# Patient Record
Sex: Male | Born: 1985 | Race: White | Hispanic: No | Marital: Single | Smoking: Current every day smoker
Health system: Southern US, Community
[De-identification: ages and names within clinical notes are randomized; demographics above are authoritative.]

## PROBLEM LIST (undated history)

## (undated) DIAGNOSIS — E669 Obesity, unspecified: Secondary | ICD-10-CM

## (undated) DIAGNOSIS — T7840XA Allergy, unspecified, initial encounter: Secondary | ICD-10-CM

## (undated) HISTORY — DX: Allergy, unspecified, initial encounter: T78.40XA

## (undated) HISTORY — DX: Obesity, unspecified: E66.9

---
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
## 2006-05-21 ENCOUNTER — Emergency Department (HOSPITAL_COMMUNITY): Admission: EM | Admit: 2006-05-21 | Discharge: 2006-05-21 | Payer: Self-pay | Admitting: Emergency Medicine

## 2008-08-10 ENCOUNTER — Emergency Department (HOSPITAL_COMMUNITY): Admission: EM | Admit: 2008-08-10 | Discharge: 2008-08-10 | Payer: Self-pay | Admitting: Family Medicine

## 2009-06-08 ENCOUNTER — Emergency Department (HOSPITAL_COMMUNITY): Admission: EM | Admit: 2009-06-08 | Discharge: 2009-06-08 | Payer: Self-pay | Admitting: Emergency Medicine

## 2010-02-07 IMAGING — CR DG ANKLE COMPLETE 3+V*R*
3 series · 3 of 3 positions shown · non-contrast
Comparison: None

CLINICAL DATA: Pain

RIGHT ANKLE - COMPLETE 3+ VIEW

[view not recorded (1 of 3)]
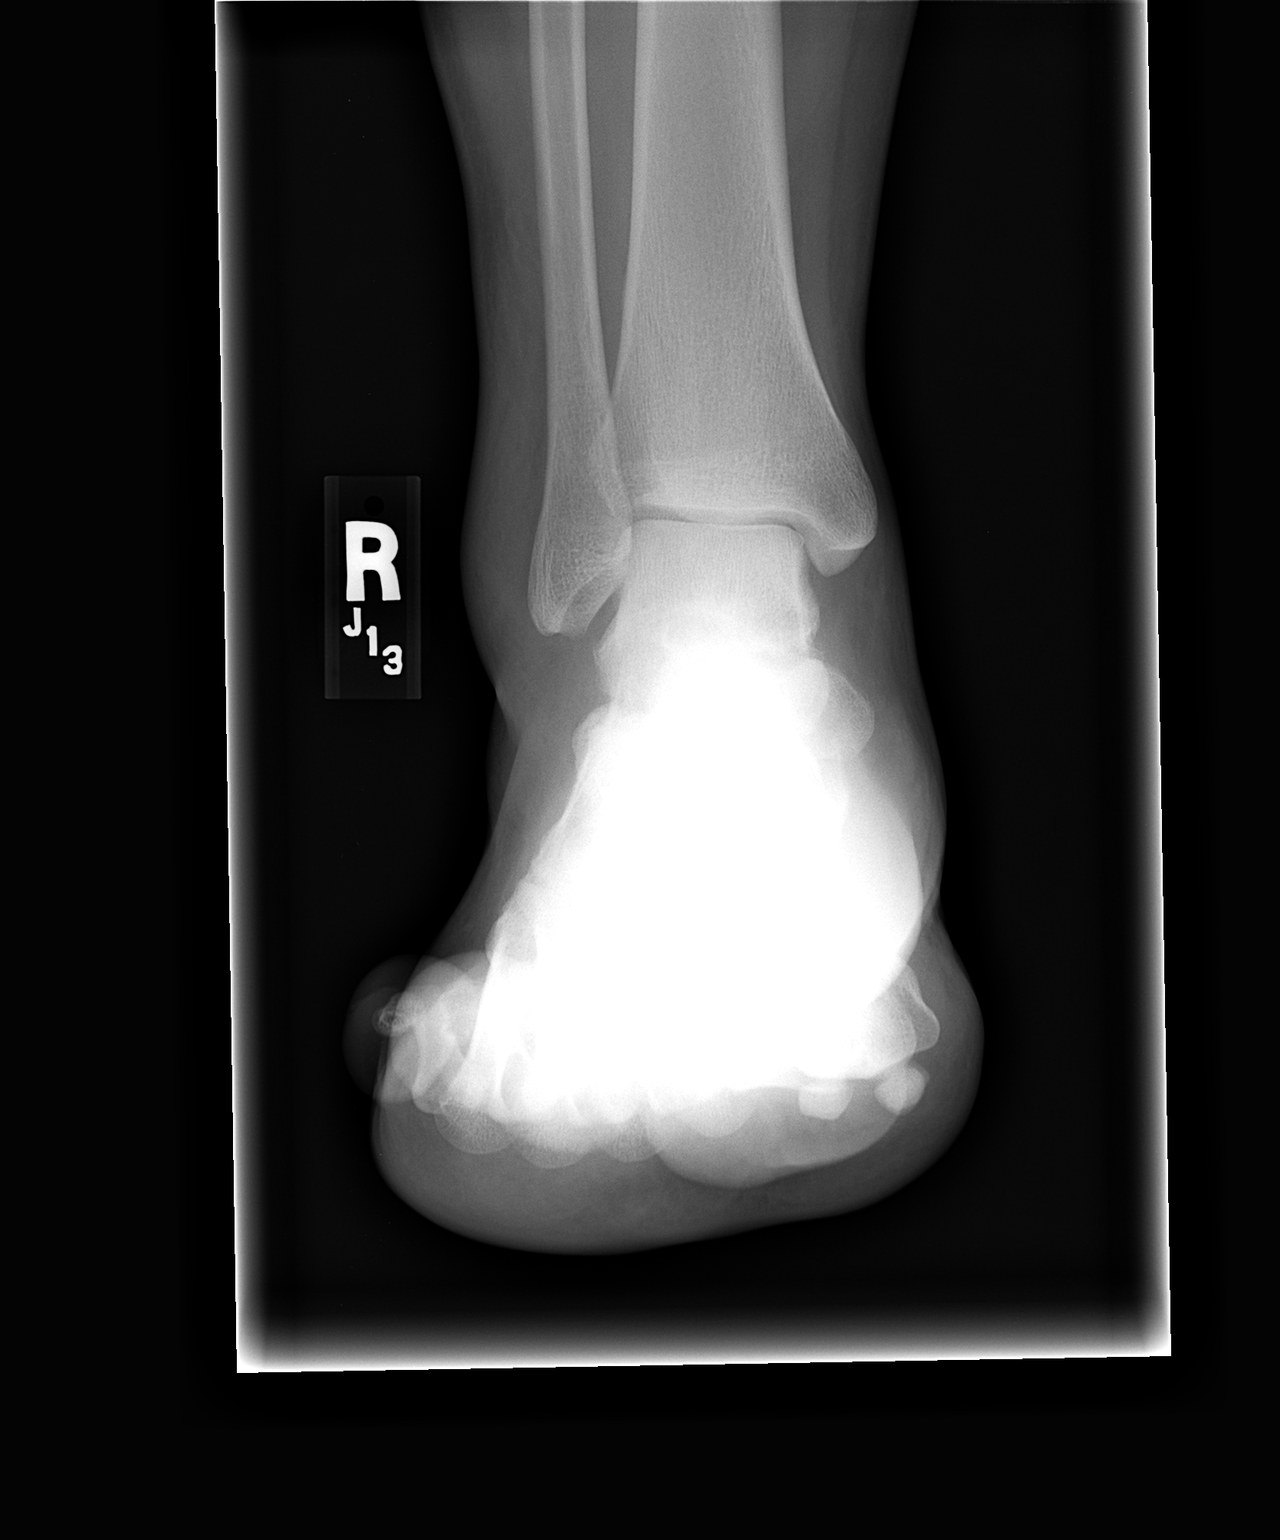

[view not recorded (2 of 3)]
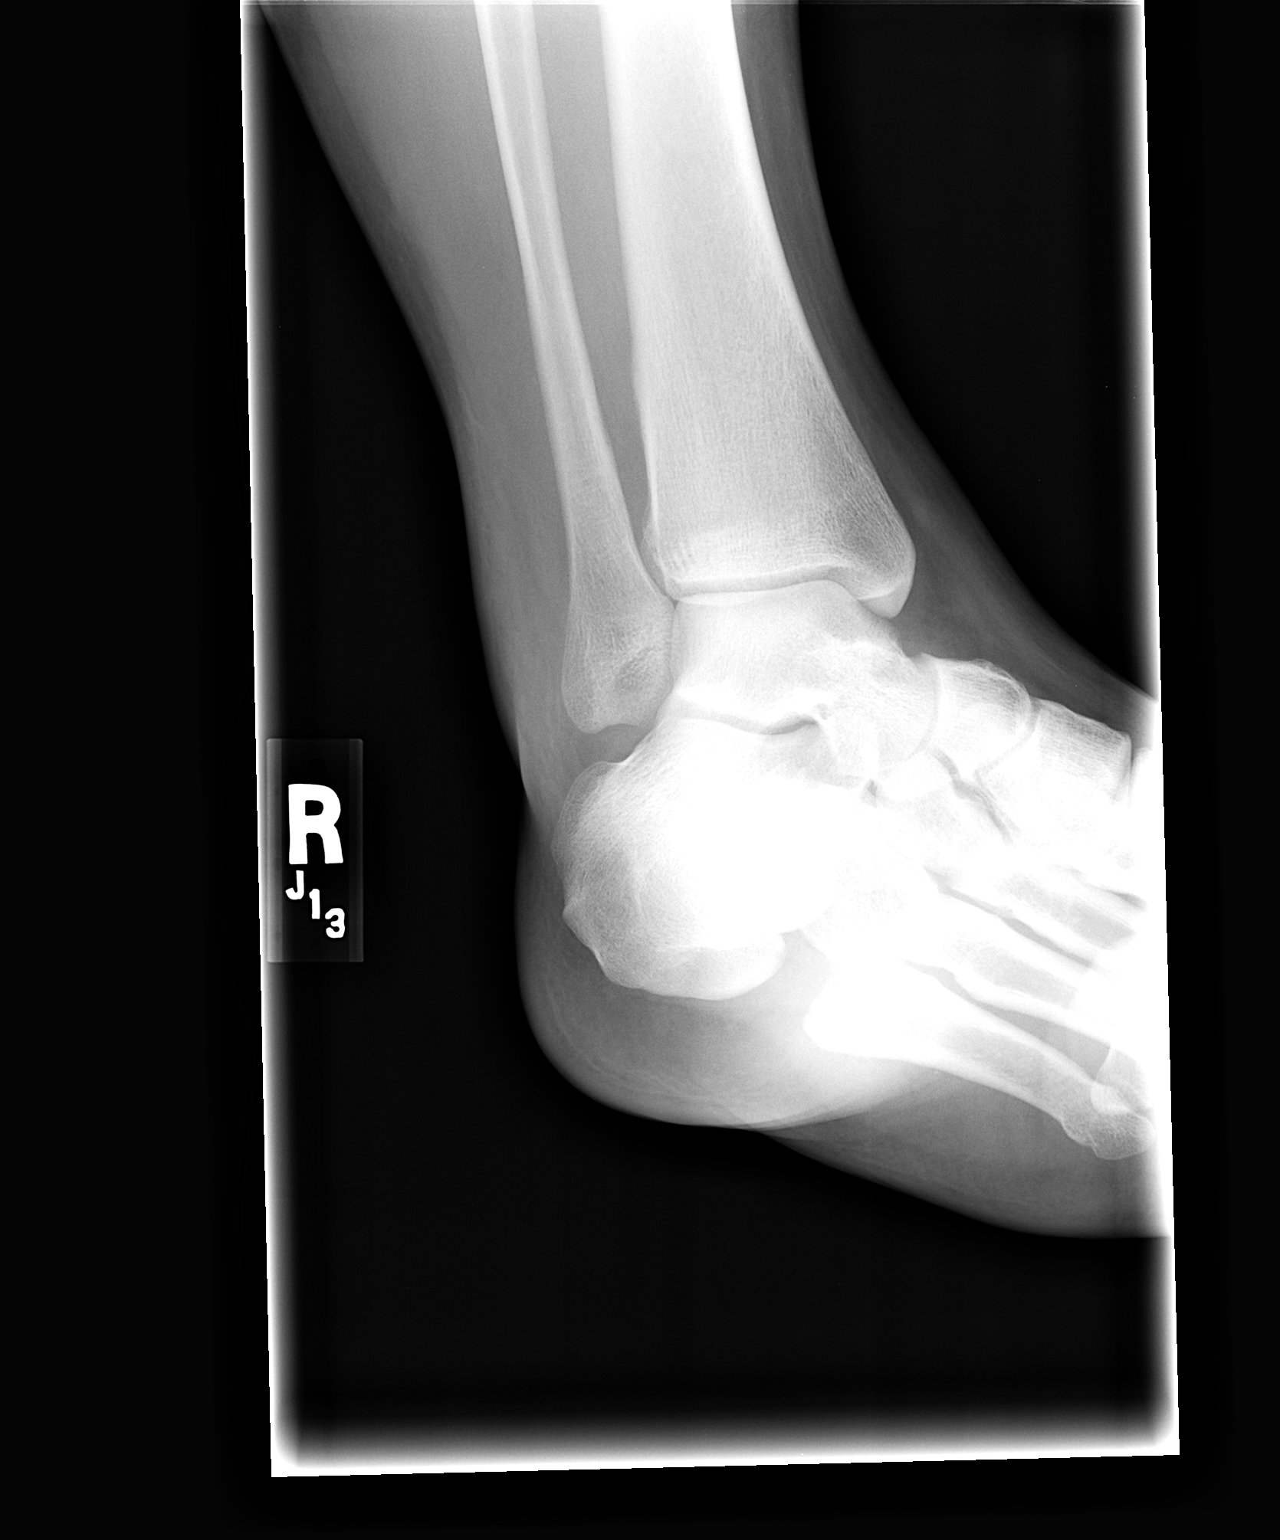

[view not recorded (3 of 3)]
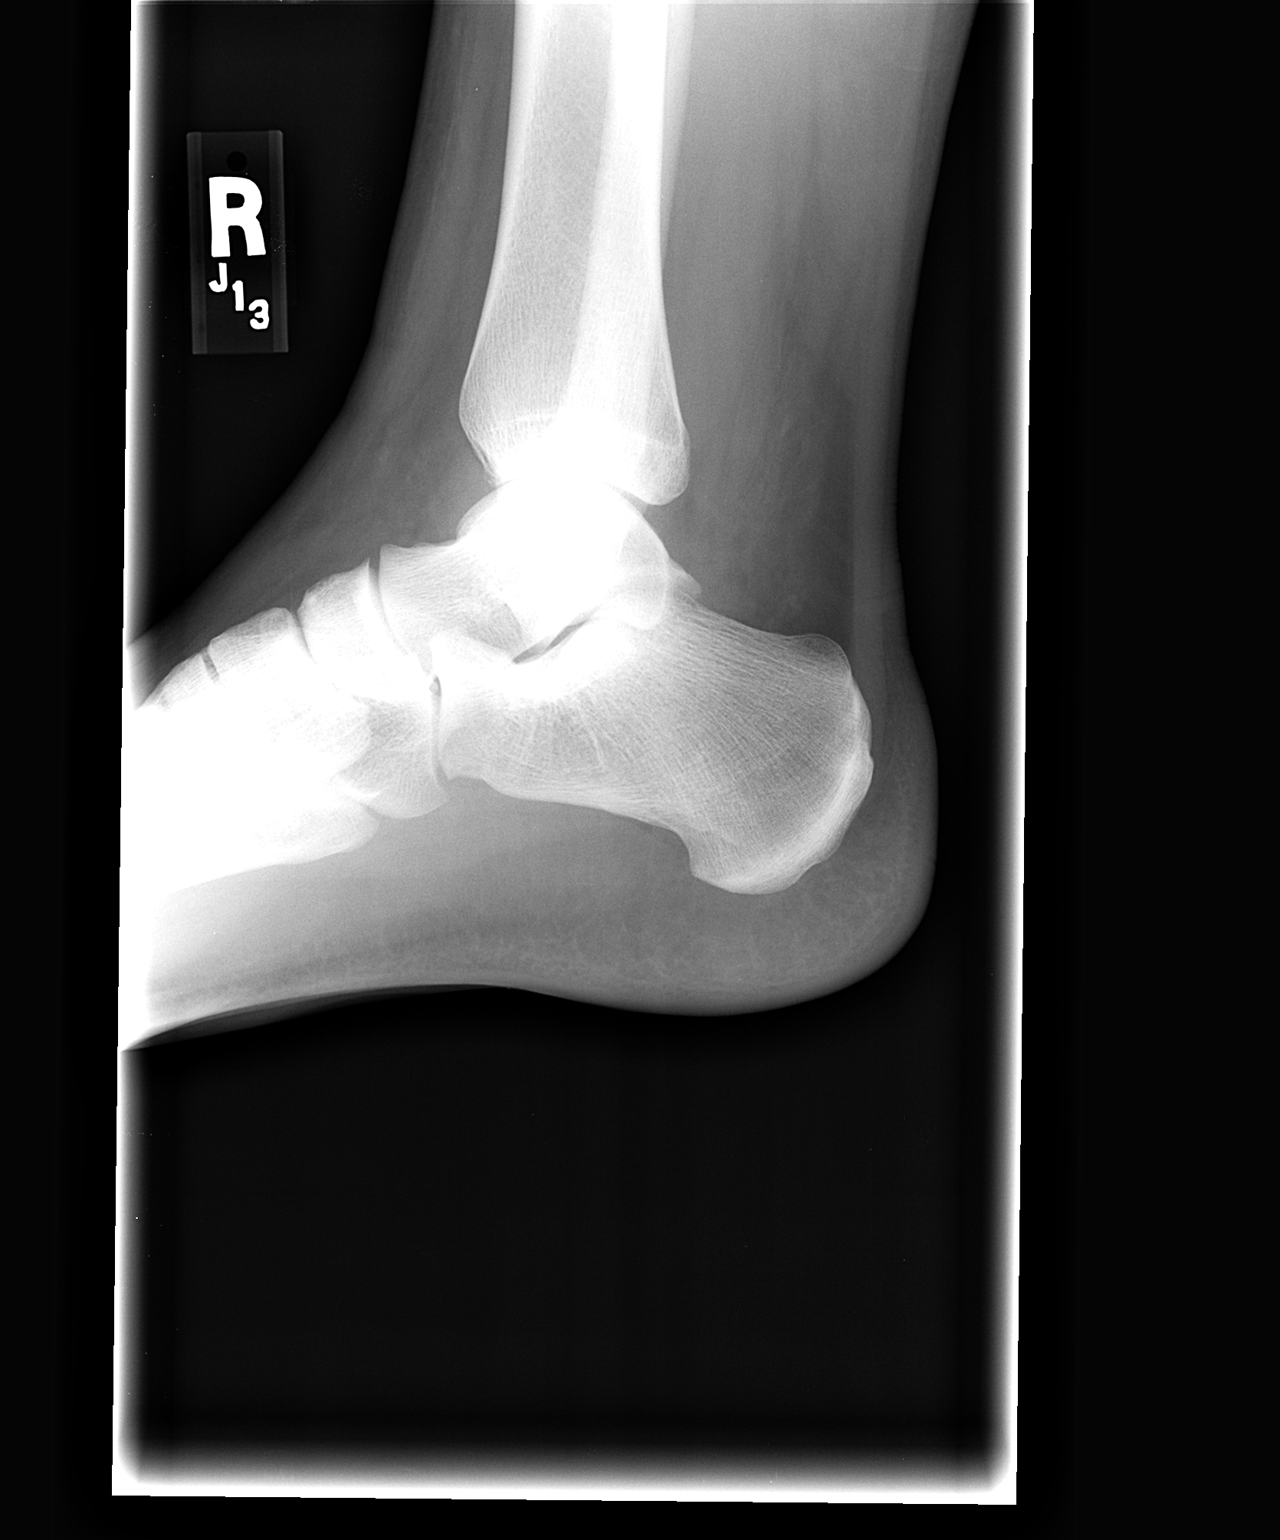

[3 of 3 positions shown; findings below may reference images not displayed]

FINDINGS: There is diffuse soft tissue swelling.

No fracture or dislocation noted.

There is no radiopaque foreign bodies or soft tissue calcifications
IMPRESSION: 1.  Soft tissue swelling.

## 2010-12-06 IMAGING — CR DG CHEST 2V
2 series · 2 of 2 positions shown · non-contrast
Comparison: 08/21/2004

CLINICAL DATA: Shortness of breath, cough

CHEST - 2 VIEW

[view not recorded (1 of 2)]
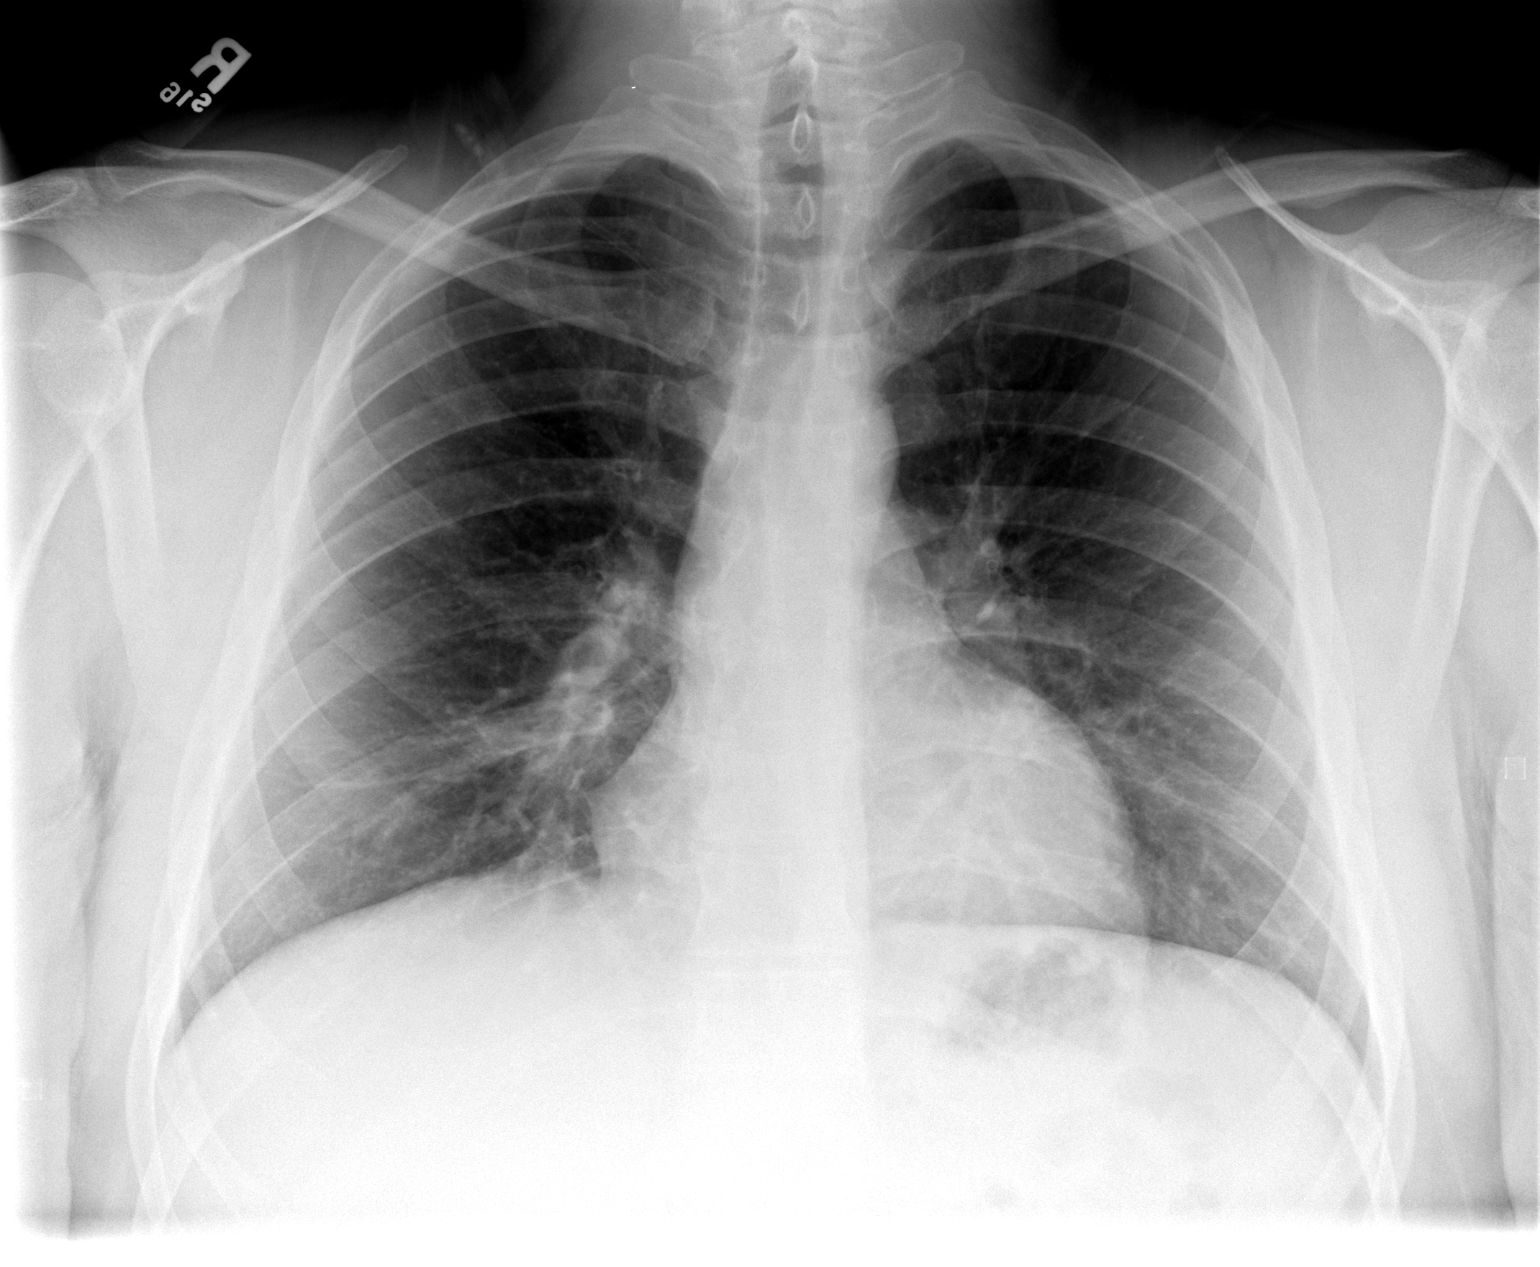

[view not recorded (2 of 2)]
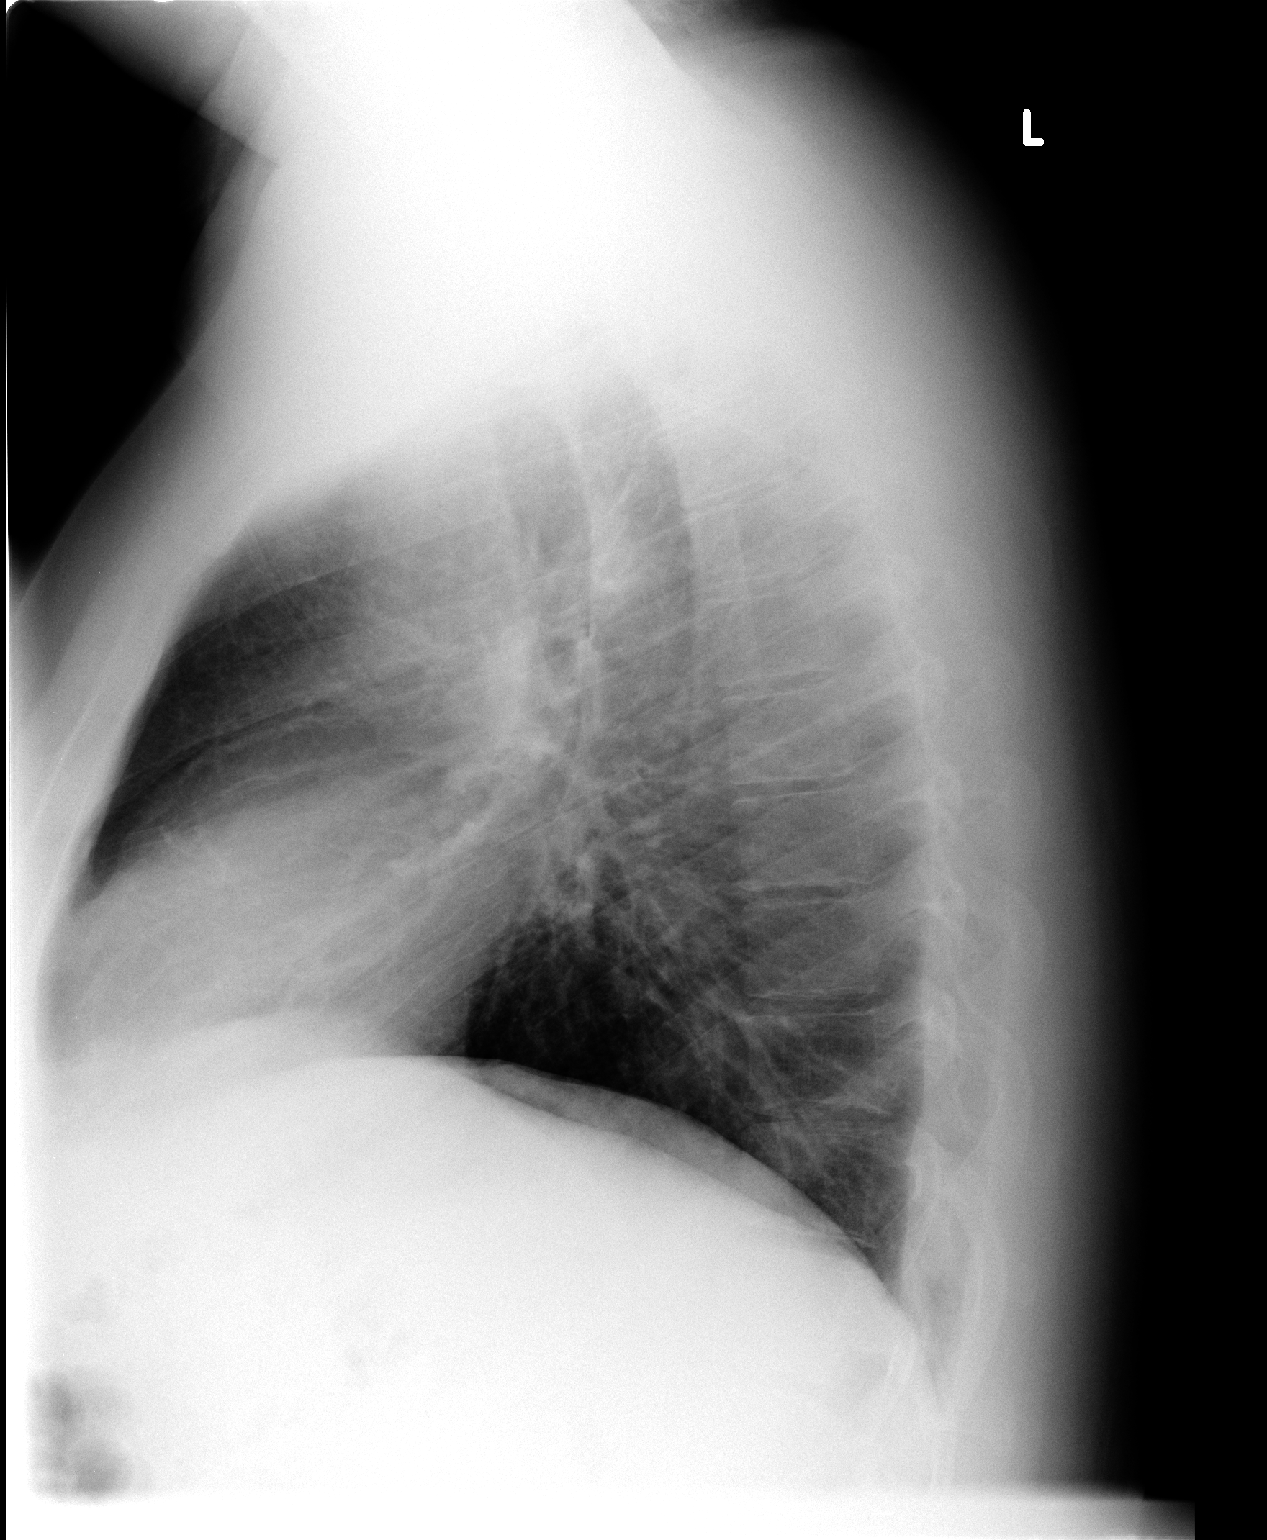

[2 of 2 positions shown; findings below may reference images not displayed]

FINDINGS: The cardiomediastinal silhouette is stable.  No acute
infiltrate or pleural effusion.  No pulmonary edema.  Bony thorax
is stable.
IMPRESSION: No active disease.

## 2011-09-21 ENCOUNTER — Ambulatory Visit: Payer: 59

## 2011-09-23 ENCOUNTER — Encounter (HOSPITAL_BASED_OUTPATIENT_CLINIC_OR_DEPARTMENT_OTHER): Payer: Self-pay | Admitting: *Deleted

## 2011-09-23 ENCOUNTER — Emergency Department (HOSPITAL_BASED_OUTPATIENT_CLINIC_OR_DEPARTMENT_OTHER)
Admission: EM | Admit: 2011-09-23 | Discharge: 2011-09-23 | Disposition: A | Discharge status: Home / Self Care | Payer: 59 | Attending: Emergency Medicine | Admitting: Emergency Medicine

## 2011-09-23 DIAGNOSIS — L089 Local infection of the skin and subcutaneous tissue, unspecified: Secondary | ICD-10-CM | POA: Insufficient documentation

## 2011-09-23 DIAGNOSIS — J45909 Unspecified asthma, uncomplicated: Secondary | ICD-10-CM | POA: Insufficient documentation

## 2011-09-23 MED ORDER — ALBUTEROL SULFATE HFA 108 (90 BASE) MCG/ACT IN AERS: 1.0000 | INHALATION_SPRAY | Freq: Four times a day (QID) | RESPIRATORY_TRACT | Status: DC | PRN

## 2011-09-23 MED ORDER — SULFAMETHOXAZOLE-TRIMETHOPRIM 800-160 MG PO TABS: 1.0000 | ORAL_TABLET | Freq: Two times a day (BID) | ORAL | Status: AC

## 2011-09-23 NOTE — ED Notes (Signed)
Pt states he has had a boil on the right side of his nose for a couple of days.

## 2011-09-23 NOTE — ED Provider Notes (Signed)
Medical screening examination/treatment/procedure(s) were performed by non-physician practitioner and as supervising physician I was immediately available for consultation/collaboration.   Eman Morimoto A Gabi Mcfate, MD 09/23/11 1530 

## 2011-09-23 NOTE — ED Provider Notes (Signed)
History     CSN: [id number]  Arrival date & time 09/23/11  1420   First MD Initiated Contact with Patient 09/23/11 1425      Chief Complaint  Patient presents with  . Recurrent Skin Infections    (Consider location/radiation/quality/duration/timing/severity/associated sxs/prior treatment) HPI Comments: Pt states that he noticed and area that is red raised and hard to the right side of the nose where his glasses rub:pt state that he has a history of abscess so he is concerned that it is going to turn into that:pt also requesting a refill on his albuterol since he doesn't have a pcp at this time  Patient is a 26 y.o. male presenting with rash. The history is provided by the patient. No language interpreter was used.  Rash  This is a new problem. The current episode started 2 days ago. The problem is associated with an unknown factor. There has been no fever. Affected Location: nose. The pain is mild. The pain has been constant since onset. Associated symptoms include pain. Pertinent negatives include no blisters, no itching and no weeping. He has tried nothing for the symptoms.    Past Medical History  Diagnosis Date  . Asthma     History reviewed. No pertinent past surgical history.  History reviewed. No pertinent family history.  History  Substance Use Topics  . Smoking status: Never Smoker   . Smokeless tobacco: Not on file  . Alcohol Use: No      Review of Systems  Skin: Positive for rash. Negative for itching.  All other systems reviewed and are negative.    Allergies  Review of patient's allergies indicates no known allergies.  Home Medications   Current Outpatient Rx  Name Route Sig Dispense Refill  . ALBUTEROL IN Inhalation Inhale into the lungs 4 (four) times daily as needed.    Marland Kitchen FLUTICASONE-SALMETEROL 250-50 MCG/DOSE IN AEPB Inhalation Inhale 1 puff into the lungs every 12 (twelve) hours.      BP 152/94  Pulse 104  Temp(Src) 97.8 F (36.6 C) (Oral)   Resp 20  Ht 5\' 11"  (1.803 m)  Wt 238 lb (107.956 kg)  BMI 33.19 kg/m2  SpO2 98%  Physical Exam  Nursing note and vitals reviewed. Constitutional: He is oriented to person, place, and time. He appears well-developed and well-nourished.  HENT:       Has a red raised firm area to the bridge of the nose on the right side:no drainage noted to the area  Eyes: Conjunctivae and EOM are normal.  Neck: Neck supple.  Cardiovascular: Normal rate and regular rhythm.   Pulmonary/Chest: Effort normal and breath sounds normal.  Musculoskeletal: Normal range of motion.  Neurological: He is alert and oriented to person, place, and time.  Skin: Skin is warm.  Psychiatric: He has a normal mood and affect.    ED Course  Procedures (including critical care time)  Labs Reviewed - No data to display No results found.   1. Skin infection       MDM  Possible early abscess although may be a sebaceous cyst:will treat for possible infection        Teressa Lower, NP 09/23/11 1447

## 2011-09-23 NOTE — Discharge Instructions (Signed)
Skin Infections A skin infection usually develops as a result of disruption of the skin barrier.  CAUSES  A skin infection might occur following:  Trauma or an injury to the skin such as a cut or insect sting.   Inflammation (as in eczema).   Breaks in the skin between the toes (as in athlete's foot).   Swelling (edema).  SYMPTOMS  The legs are the most common site affected. Usually there is:  Redness.   Swelling.   Pain.   There may be red streaks in the area of the infection.  TREATMENT   Minor skin infections may be treated with topical antibiotics, but if the skin infection is severe, hospital care and intravenous (IV) antibiotic treatment may be needed.   Most often skin infections can be treated with oral antibiotic medicine as well as proper rest and elevation of the affected area until the infection improves.   If you are prescribed oral antibiotics, it is important to take them as directed and to take all the pills even if you feel better before you have finished all of the medicine.   You may apply warm compresses to the area for 20-30 minutes 4 times daily.  You might need a tetanus shot now if:  You have no idea when you had the last one.   You have never had a tetanus shot before.   Your wound had dirt in it.  If you need a tetanus shot and you decide not to get one, there is a rare chance of getting tetanus. Sickness from tetanus can be serious. If you get a tetanus shot, your arm may swell and become red and warm at the shot site. This is common and not a problem. SEEK MEDICAL CARE IF:  The pain and swelling from your infection do not improve within 2 days.  SEEK IMMEDIATE MEDICAL CARE IF:  You develop a fever, chills, or other serious problems.  Document Released: 08/24/2004 Document Revised: 03/29/2011 Document Reviewed: 07/06/2008 [employer] Patient Information 2012 [employer]. 

## 2011-09-25 ENCOUNTER — Encounter (HOSPITAL_BASED_OUTPATIENT_CLINIC_OR_DEPARTMENT_OTHER): Payer: Self-pay | Admitting: *Deleted

## 2011-09-25 ENCOUNTER — Observation Stay (HOSPITAL_BASED_OUTPATIENT_CLINIC_OR_DEPARTMENT_OTHER)
Admission: EM | Admit: 2011-09-25 | Discharge: 2011-09-25 | Disposition: A | Discharge status: Home / Self Care | Payer: 59 | Attending: Emergency Medicine | Admitting: Emergency Medicine

## 2011-09-25 ENCOUNTER — Emergency Department (INDEPENDENT_AMBULATORY_CARE_PROVIDER_SITE_OTHER): Payer: 59

## 2011-09-25 DIAGNOSIS — R220 Localized swelling, mass and lump, head: Secondary | ICD-10-CM

## 2011-09-25 DIAGNOSIS — J3489 Other specified disorders of nose and nasal sinuses: Secondary | ICD-10-CM | POA: Insufficient documentation

## 2011-09-25 DIAGNOSIS — J45909 Unspecified asthma, uncomplicated: Secondary | ICD-10-CM | POA: Insufficient documentation

## 2011-09-25 DIAGNOSIS — L03213 Periorbital cellulitis: Secondary | ICD-10-CM

## 2011-09-25 DIAGNOSIS — L0201 Cutaneous abscess of face: Principal | ICD-10-CM | POA: Insufficient documentation

## 2011-09-25 DIAGNOSIS — L03211 Cellulitis of face: Principal | ICD-10-CM | POA: Insufficient documentation

## 2011-09-25 LAB — BASIC METABOLIC PANEL
CO2: 23 mEq/L (ref 19–32)
Chloride: 103 mEq/L (ref 96–112)
Glucose, Bld: 158 mg/dL — ABNORMAL HIGH (ref 70–99)
Potassium: 3.4 mEq/L — ABNORMAL LOW (ref 3.5–5.1)
Sodium: 138 mEq/L (ref 135–145)

## 2011-09-25 LAB — CBC
MCV: 91.7 fL (ref 78.0–100.0)
Platelets: 219 10*3/uL (ref 150–400)
RBC: 4.59 MIL/uL (ref 4.22–5.81)
RDW: 11.8 % (ref 11.5–15.5)
WBC: 10.5 10*3/uL (ref 4.0–10.5)

## 2011-09-25 LAB — DIFFERENTIAL
Basophils Absolute: 0 10*3/uL (ref 0.0–0.1)
Lymphocytes Relative: 31 % (ref 12–46)
Lymphs Abs: 3.2 10*3/uL (ref 0.7–4.0)
Neutro Abs: 6 10*3/uL (ref 1.7–7.7)

## 2011-09-25 MED ORDER — SODIUM CHLORIDE 0.9 % IV BOLUS (SEPSIS)
1000.0000 mL | Freq: Once | INTRAVENOUS | Status: AC
  Administered 2011-09-25: 1000 mL via INTRAVENOUS

## 2011-09-25 MED ORDER — SODIUM CHLORIDE 0.9 % IV SOLN: Freq: Once | INTRAVENOUS | Status: DC

## 2011-09-25 MED ORDER — HYDROCODONE-ACETAMINOPHEN 5-325 MG PO TABS: 1.0000 | ORAL_TABLET | ORAL | Status: AC | PRN

## 2011-09-25 MED ORDER — IOHEXOL 300 MG/ML  SOLN
80.0000 mL | Freq: Once | INTRAMUSCULAR | Status: AC | PRN
  Administered 2011-09-25: 80 mL via INTRAVENOUS

## 2011-09-25 MED ORDER — MORPHINE SULFATE 4 MG/ML IJ SOLN
6.0000 mg | Freq: Once | INTRAMUSCULAR | Status: AC
  Administered 2011-09-25: 6 mg via INTRAVENOUS
  Filled 2011-09-25 (×2): qty 1

## 2011-09-25 MED ORDER — ACETAMINOPHEN 325 MG PO TABS: 650.0000 mg | ORAL_TABLET | ORAL | Status: DC | PRN

## 2011-09-25 MED ORDER — AMOXICILLIN-POT CLAVULANATE 875-125 MG PO TABS: 1.0000 | ORAL_TABLET | Freq: Two times a day (BID) | ORAL | Status: AC

## 2011-09-25 MED ORDER — MORPHINE SULFATE 4 MG/ML IJ SOLN
6.0000 mg | Freq: Once | INTRAMUSCULAR | Status: AC
  Administered 2011-09-25: 6 mg via INTRAVENOUS
  Filled 2011-09-25: qty 2

## 2011-09-25 MED ORDER — VANCOMYCIN HCL IN DEXTROSE 1-5 GM/200ML-% IV SOLN
1000.0000 mg | Freq: Two times a day (BID) | INTRAVENOUS | Status: DC
  Administered 2011-09-25: 1000 mg via INTRAVENOUS
  Filled 2011-09-25: qty 200

## 2011-09-25 MED ORDER — VANCOMYCIN HCL IN DEXTROSE 1-5 GM/200ML-% IV SOLN
1000.0000 mg | Freq: Once | INTRAVENOUS | Status: AC
  Administered 2011-09-25: 1000 mg via INTRAVENOUS
  Filled 2011-09-25: qty 200

## 2011-09-25 MED ORDER — FENTANYL CITRATE 0.05 MG/ML IJ SOLN
50.0000 ug | Freq: Once | INTRAMUSCULAR | Status: AC
  Administered 2011-09-25: 50 ug via INTRAVENOUS

## 2011-09-25 MED ORDER — SODIUM CHLORIDE 0.9 % IV SOLN
1000.0000 mL | INTRAVENOUS | Status: DC
  Administered 2011-09-25: 1000 mL via INTRAVENOUS

## 2011-09-25 MED ORDER — SODIUM CHLORIDE 0.9 % IV SOLN
3.0000 g | Freq: Four times a day (QID) | INTRAVENOUS | Status: DC
  Administered 2011-09-25 (×2): 3 g via INTRAVENOUS
  Filled 2011-09-25 (×2): qty 3

## 2011-09-25 MED ORDER — FENTANYL CITRATE 0.05 MG/ML IJ SOLN
INTRAMUSCULAR | Status: AC
  Administered 2011-09-25: 50 ug via INTRAVENOUS
  Filled 2011-09-25: qty 2

## 2011-09-25 MED ORDER — MORPHINE SULFATE 4 MG/ML IJ SOLN
4.0000 mg | INTRAMUSCULAR | Status: DC | PRN
  Administered 2011-09-25 (×2): 4 mg via INTRAVENOUS
  Filled 2011-09-25 (×2): qty 1

## 2011-09-25 MED ORDER — POTASSIUM CHLORIDE CRYS ER 20 MEQ PO TBCR
20.0000 meq | EXTENDED_RELEASE_TABLET | Freq: Once | ORAL | Status: AC
  Administered 2011-09-25: 20 meq via ORAL
  Filled 2011-09-25: qty 1

## 2011-09-25 MED ORDER — ZOLPIDEM TARTRATE 5 MG PO TABS: 5.0000 mg | ORAL_TABLET | Freq: Every evening | ORAL | Status: DC | PRN

## 2011-09-25 MED ORDER — ONDANSETRON HCL 4 MG/2ML IJ SOLN
4.0000 mg | Freq: Four times a day (QID) | INTRAMUSCULAR | Status: DC | PRN
  Administered 2011-09-25: 4 mg via INTRAVENOUS
  Filled 2011-09-25: qty 2

## 2011-09-25 MED ORDER — SODIUM CHLORIDE 0.9 % IV SOLN
3.0000 g | Freq: Once | INTRAVENOUS | Status: AC
  Administered 2011-09-25: 3 g via INTRAVENOUS
  Filled 2011-09-25: qty 3

## 2011-09-25 NOTE — ED Provider Notes (Signed)
Medical screening examination/treatment/procedure(s) were conducted as a shared visit with non-physician practitioner(s) and myself.  I personally evaluated the patient during the encounter  Transferred from [facility] for CDU cellulitis protocol. Stable. Findings cw preseptal cellulitis of right eye.   Shelda Jakes, MD 09/25/11 [phone]

## 2011-09-25 NOTE — ED Notes (Signed)
Pt to cdu for cellulitis protocol . Pt seen on Saturday for erythremic macule on medial portion of rt orbit. Pt did not improve on PO abx and presents to day with erythema surrounding entire orbit. Pt having trouble opening eyelid due to swelling. Denies fever. Pt in no acute distress.

## 2011-09-25 NOTE — ED Notes (Signed)
Pt arrival from [facility]

## 2011-09-25 NOTE — ED Notes (Signed)
Via [facility] for transfer to [facility] CDU

## 2011-09-25 NOTE — ED Notes (Signed)
Awaiting pt from [facility]

## 2011-09-25 NOTE — ED Provider Notes (Signed)
History     CSN: [id number]  Arrival date & time 09/25/11  0610   First MD Initiated Contact with Patient 09/25/11 [phone]      Chief Complaint  Patient presents with  . Cellulitis    (Consider location/radiation/quality/duration/timing/severity/associated sxs/prior treatment) HPI Comments: Seen 2 days ago for "pimple" to bridge of nose at sight of where glasses sit.  Increased redness, swelling, pain to bridge of nose and R eye.  No loss of vision.  No fever, vomiting.  No pain with eye movement.  No difficulty breathing or swallowing.  Noticed some drainage to bridge of nose this morning.  Pictures on patient's cell phone show impressive progression of orbital swelling.  The history is provided by the patient.    Past Medical History  Diagnosis Date  . Asthma     History reviewed. No pertinent past surgical history.  Family History  Problem Relation Age of Onset  . Diabetes Mother   . Heart failure Father     History  Substance Use Topics  . Smoking status: Never Smoker   . Smokeless tobacco: Not on file  . Alcohol Use: No      Review of Systems  Constitutional: Negative for fever, activity change and appetite change.  HENT: Positive for facial swelling. Negative for nosebleeds, sore throat, trouble swallowing, neck pain, neck stiffness and voice change.   Eyes: Positive for pain and redness. Negative for visual disturbance.  Respiratory: Negative for cough, chest tightness and shortness of breath.   Cardiovascular: Negative for chest pain.  Gastrointestinal: Negative for nausea, vomiting and abdominal pain.  Genitourinary: Negative for dysuria and hematuria.  Skin: Negative for rash.  Neurological: Negative for dizziness, seizures and headaches.    Allergies  Review of patient's allergies indicates no known allergies.  Home Medications   Current Outpatient Rx  Name Route Sig Dispense Refill  . ALBUTEROL SULFATE HFA 108 (90 BASE) MCG/ACT IN AERS Inhalation  Inhale 1-2 puffs into the lungs every 6 (six) hours as needed for wheezing. 1 Inhaler 0  . ALBUTEROL IN Inhalation Inhale into the lungs 4 (four) times daily as needed.    Marland Kitchen FLUTICASONE-SALMETEROL 250-50 MCG/DOSE IN AEPB Inhalation Inhale 1 puff into the lungs every 12 (twelve) hours.    . SULFAMETHOXAZOLE-TRIMETHOPRIM 800-160 MG PO TABS Oral Take 1 tablet by mouth 2 (two) times daily. 14 tablet 0    BP 168/96  Pulse 109  Temp(Src) 98.6 F (37 C) (Oral)  Resp 20  SpO2 95%  Physical Exam  Constitutional: He is oriented to person, place, and time. He appears well-developed and well-nourished. No distress.  HENT:  Head: Normocephalic and atraumatic.    Mouth/Throat: Oropharynx is clear and moist. No oropharyngeal exudate.  Eyes: Conjunctivae and EOM are normal. Pupils are equal, round, and reactive to light. Right eye exhibits normal extraocular motion. Left eye exhibits normal extraocular motion.       Extensive R orbital edema and erythema.  EOMI intact, vision intact.   Neck: Normal range of motion. Neck supple.       No meningismus  Cardiovascular: Normal rate, regular rhythm and normal heart sounds.   Pulmonary/Chest: Effort normal and breath sounds normal. No respiratory distress.  Abdominal: Soft. There is no tenderness. There is no rebound and no guarding.  Musculoskeletal: Normal range of motion. He exhibits no edema and no tenderness.  Neurological: He is alert and oriented to person, place, and time. No cranial nerve deficit.  Skin: Skin is warm.  ED Course  Procedures (including critical care time)  Labs Reviewed  BASIC METABOLIC PANEL - Abnormal; Notable for the following:    Potassium 3.4 (*)    Glucose, Bld 158 (*)    All other components within normal limits  CBC  DIFFERENTIAL  LACTIC ACID, PLASMA  CULTURE, BLOOD (ROUTINE X 2)  CULTURE, BLOOD (ROUTINE X 2)  PROCALCITONIN   Ct Orbits W/cm  09/25/2011  *RADIOLOGY REPORT*  Clinical Data: Swelling and  inflammation of the right eye.  CT ORBITS WITH CONTRAST  Technique:  Multidetector CT imaging of the orbits was performed following the bolus administration of intravenous contrast.  Contrast: 80mL OMNIPAQUE IOHEXOL 300 MG/ML IV SOLN  Comparison: None.  Findings: There is nonspecific soft tissue swelling of the right side of the face including a periorbital regions.  Given the history, this would be consistent with cellulitis.  All of the changes appear to be preseptal.  I do not see any post septal pathology in the orbit.  The globe appears normal.  Optic nerve appears normal.  Extraocular muscles appear normal.  Lacrimal gland appears normal.  There are no hypo enhancing fluid collections to suggest frank abscess.  There is a small amount of fluid or mucus in the right maxillary sinus.  No osseous abnormality.  IMPRESSION: Nonspecific superficial soft tissue swelling surrounding the right orbit consistent with superficial cellulitis.  All the swelling appears preseptal.  No post-septal pathology is seen.  No hypo enhancing collection to suggest identifiable abscess.  Inflammatory changes of the right maxillary sinus.  Original Report Authenticated By: Thomasenia Sales, M.D.     1. Preseptal cellulitis       MDM  Facial cellulitis with possible abscess.  R/o septal cellulitis.    IV abx, labs, imaging.  Discussed CT findings with Dr. Karin Golden. No abscess or postseptal involvement seen on CT.  Air along R lateral orbit is likely pocket between lid and globe.  Patient would benefit from further IV antibiotics and monitoring in CDU.  D/w [facility] and Dr. Deretha Emory.     Glynn Octave, MD 09/25/11 [phone]

## 2011-09-25 NOTE — ED Provider Notes (Signed)
Patient reports feeling better.  Edema of right eye has improved and he is able to open eye better.  Pain currently controlled.  I numbed right paranasal abscess w/ local 2% lidocaine w/out epi and performed a needle aspiration.  [name] out a small amount of pus.  Pt tolerated well.  He will likely be discharged after second dose of unasyn and vancomycin.  7:25 PM   Pt received second dose of both abx.  Pain as well as erythema and edema continue to improve.  Pt d/c'd home w/ augmentin and vicodin. Recommended f/u in 48hrs and Return precautions discussed.    Otilio Miu, [place] 09/25/11 [phone]

## 2011-09-25 NOTE — ED Notes (Signed)
Meal ordred

## 2011-09-25 NOTE — ED Notes (Signed)
Pt. Discharged to home, pt. Alert and oriented, gait steady, NAD noted 

## 2011-09-25 NOTE — Discharge Instructions (Signed)
Discontinue bactrim and start augmentin.  Take vicodin as prescribed for severe pain.   Do not drive within four hours of taking this medication (may cause drowsiness or confusion).  Follow up with your family doctor for recheck in 2 days.  If you do not have a doctor, follow up with urgent care or return to the ED.  You should be seen sooner if redness/swelling worsens, you develop vision changes or pain w/ eye movement. Periorbital Cellulitis Periorbital cellulitis is a common infection that can affect the eyelid and the soft tissues that surround the eyeball. The infection may also affect the structures that produce and drain tears. It does not affect the eyeball itself. Natural tissue barriers usually prevent the spread of this infection to the eyeball and other deeper areas of the eye socket.  CAUSES  Bacterial infection.   Long-term (chronic) sinus infections.   An object (foreign body) stuck behind the eye.   An injury that goes through the eyelid tissues.   An injury that causes an infection, such as an insect sting.   Fracture of the bone around the eye.   Infections which have spread from the eyelid or other structures around the eye.   Bite wounds.   Inflammation or infection of the lining membranes of the brain (meningitis).   An infection in the blood (septicemia).   Dental infection (abscess).   Viral infection (this is rare).  SYMPTOMS Symptoms usually come on suddenly.  Pain in the eye.   Red, hot, and swollen eyelids and possibly cheeks. The swelling is sometimes bad enough that the eyelids cannot open. Some infections make the eyelids look purple.   Fever and feeling generally ill.   Pain when touching the area around the eye.  DIAGNOSIS  Periorbital cellulitis can be diagnosed from an eye exam. In severe cases, your caregiver might suggest:  Blood tests.   Imaging tests (such as a CT scan) to examine the sinuses and the area around and behind the  eyeball.  TREATMENT If your caregiver feels that you do not have any signs of serious infection, treatment may include:  Antibiotics.   Nasal decongestants to reduce swelling.   Referral to a dentist if it is suspected that the infection was caused by a prior tooth infection.   Examination every day to make sure the problem is improving.  HOME CARE INSTRUCTIONS  Take your antibiotics as directed. Finish them even if you start to feel better.   Some pain is normal with this condition. Take pain medicine as directed by your caregiver. Only take pain medicines approved by your caregiver.   It is important to drink fluids. Drink enough water and fluids to keep your urine clear or pale yellow.   Do not smoke.   Rest and get plenty of sleep.   Mild or moderate fevers generally have no long-term effects and often do not require treatment.   If your caregiver has given you a follow-up appointment, it is very important to keep that appointment. Your caregiver will need to make sure that the infection is getting better. It is important to check that a more serious infection is not developing.  SEEK IMMEDIATE MEDICAL CARE IF:  Your eyelids become more painful, red, warm, or swollen.   You develop double vision or your vision becomes blurred or worsens in any way.   You have trouble moving your eyes.   The eye looks like it is popping out (proptosis).   You develop  a severe headache, severe neck pain, or neck stiffness.   You develop repeated vomiting.   You have a fever or persistent symptoms for more than 72 hours.   You have a fever and your symptoms suddenly get worse.  MAKE SURE YOU:  Understand these instructions.   Will watch your condition.   Will get help right away if you are not doing well or get worse.  Document Released: 08/19/2010 Document Revised: 03/29/2011 Document Reviewed: 08/19/2010 [facility] Patient Information 2012 [place], [place].

## 2011-09-25 NOTE — ED Notes (Signed)
Pt states that he was seen here on Sat with a small pimple above right eye pt was started on PO abx tx and sx have worsened significantly since original visit denies fever or N/V pt is unable to open right eye due to swelling

## 2011-09-25 NOTE — ED Notes (Signed)
C/o increased pain to orbit.

## 2011-09-25 NOTE — ED Provider Notes (Signed)
Patient transferred to CDU from [facility] for cellulitis protocol.  This is a shared visit with Dr Manus Gunning.  Patient with preseptal cellulitis of right eye.  No pain with eye movement, no visual changes.  No needs at this time.  Plan is for vancomycin and unasyn, anticipating discharge tonight or tomorrow morning pending improvement.    IMPRESSION: Nonspecific superficial soft tissue swelling surrounding the right orbit consistent with superficial cellulitis. All the swelling appears preseptal. No post-septal pathology is seen. No hypo enhancing collection to suggest identifiable abscess.   2:58 PM Cellulitis improved.  Pt is able to open his eye more than he was this morning, swelling has decreased.   3:41 PM Discussed patient with Kyung Bacca, PA-C, who assumes care of patient at change of shift.    Dillard Cannon [place], [place] 09/25/11 1544

## 2011-09-25 NOTE — ED Notes (Signed)
Patient able to read line 10 of eye chart with both eyes and with left eye (20/13).  Patient able to read line 8 of eye chart with right eye (20/20).  Patient had to manually hold right eye open during the exam due to swelling.

## 2011-09-27 NOTE — Progress Notes (Signed)
Observation review is complete for 09/25/2011 visit.

## 2011-09-28 NOTE — ED Provider Notes (Signed)
Medical screening examination/treatment/procedure(s) were performed by non-physician practitioner and as supervising physician I was immediately available for consultation/collaboration.   Gwyneth Sprout, MD 09/28/11 [phone]

## 2011-09-29 ENCOUNTER — Ambulatory Visit (INDEPENDENT_AMBULATORY_CARE_PROVIDER_SITE_OTHER): Payer: 59 | Admitting: Family Medicine

## 2011-09-29 ENCOUNTER — Encounter: Payer: Self-pay | Admitting: Family Medicine

## 2011-09-29 DIAGNOSIS — E669 Obesity, unspecified: Secondary | ICD-10-CM

## 2011-09-29 DIAGNOSIS — J301 Allergic rhinitis due to pollen: Secondary | ICD-10-CM

## 2011-09-29 DIAGNOSIS — L03211 Cellulitis of face: Secondary | ICD-10-CM

## 2011-09-29 DIAGNOSIS — L0291 Cutaneous abscess, unspecified: Secondary | ICD-10-CM

## 2011-09-29 DIAGNOSIS — J45909 Unspecified asthma, uncomplicated: Secondary | ICD-10-CM

## 2011-09-29 MED ORDER — FLUTICASONE-SALMETEROL 250-50 MCG/DOSE IN AEPB: 1.0000 | INHALATION_SPRAY | Freq: Two times a day (BID) | RESPIRATORY_TRACT | Status: DC

## 2011-09-29 MED ORDER — ALBUTEROL SULFATE HFA 108 (90 BASE) MCG/ACT IN AERS: 1.0000 | INHALATION_SPRAY | Freq: Four times a day (QID) | RESPIRATORY_TRACT | Status: DC | PRN

## 2011-09-29 MED ORDER — CETIRIZINE HCL 10 MG PO TABS: 10.0000 mg | ORAL_TABLET | Freq: Every day | ORAL | Status: AC

## 2011-09-29 NOTE — Patient Instructions (Signed)
Asthma, Adult Asthma is caused by narrowing of the air passages in the lungs. It may be triggered by pollen, dust, animal dander, molds, some foods, respiratory infections, exposure to smoke, exercise, emotional stress or other allergens (things that cause allergic reactions or allergies). Repeat attacks are common. HOME CARE INSTRUCTIONS   Use prescription medications as ordered by your caregiver.   Avoid pollen, dust, animal dander, molds, smoke and other things that cause attacks at home and at work.   You may have fewer attacks if you decrease dust in your home. Electrostatic air cleaners may help.   It may help to replace your pillows or mattress with materials less likely to cause allergies.   Talk to your caregiver about an action plan for managing asthma attacks at home, including, the use of a peak flow meter which measures the severity of your asthma attack. An action plan can help minimize or stop the attack without having to seek medical care.   If you are not on a fluid restriction, drink 8 to 10 glasses of water each day.   Always have a plan prepared for seeking medical attention, including, calling your physician, accessing local emergency care, and calling 911 (in the [place] for a severe attack.   Discuss possible exercise routines with your caregiver.   If animal dander is the cause of asthma, you may need to get rid of pets.  SEEK MEDICAL CARE IF:   You have wheezing and shortness of breath even if taking medicine to prevent attacks.   You have muscle aches, chest pain or thickening of sputum.   Your sputum changes from clear or white to yellow, green, gray, or bloody.   You have any problems that may be related to the medicine you are taking (such as a rash, itching, swelling or trouble breathing).  SEEK IMMEDIATE MEDICAL CARE IF:   Your usual medicines do not stop your wheezing or there is increased coughing and/or shortness of breath.   You have increased  difficulty breathing.   You have a fever.  MAKE SURE YOU:   Understand these instructions.   Will watch your condition.   Will get help right away if you are not doing well or get worse.  Document Released: 07/17/2005 Document Revised: 03/29/2011 Document Reviewed: 03/04/2008 [employer] Patient Information 2012 [employer], [place]    Obesity Obesity is defined as having a body mass index (BMI) of 30 or more. To calculate your BMI divide your weight in pounds by your height in inches squared and multiply that product by 703. Major illnesses resulting from long-term obesity include: Stroke.  Heart disease.  Diabetes.  Many cancers.  Arthritis.  Obesity also complicates recovery from many other medical problems.  CAUSES  A history of obesity in your parents.  Thyroid hormone imbalance.  Environmental factors such as excess calorie intake and physical inactivity.  TREATMENT  A healthy weight loss program includes: A calorie restricted diet based on individual calorie needs.  Increased physical activity (exercise).  An exercise program is just as important as the right low-calorie diet.  Weight-loss medicines should be used only under the supervision of your physician. These medicines help, but only if they are used with diet and exercise programs. Medicines can have side effects including nervousness, nausea, abdominal pain, diarrhea, headache, drowsiness, and depression.  An unhealthy weight loss program includes: Fasting.  Fad diets.  Supplements and drugs.  These choices do not succeed in long-term weight control.  HOME CARE INSTRUCTIONS  To help you make the needed dietary changes:  Exercise and perform physical activity as directed by your caregiver.  Keep a daily record of everything you eat. There are many free websites to help you with this. It may be helpful to measure your foods so you can determine if you are eating the correct portion sizes.  Use low-calorie cookbooks or  take special cooking classes.  Avoid alcohol. Drink more water and drinks with no calories.  Take vitamins and supplements only as recommended by your caregiver.  Weight loss support groups, Registered Dieticians, counselors, and stress reduction education can also be very helpful.  Document Released: 08/24/2004 Document Revised: 03/29/2011 Document Reviewed: 06/23/2007 [facility] Patient Information 2012 [place], [place].Diabetes and Exercise Regular exercise is important and can help:   Control blood glucose (sugar).   Decrease blood pressure.    Control blood lipids (cholesterol, triglycerides).   Improve overall health.  BENEFITS FROM EXERCISE  Improved fitness.   Improved flexibility.   Improved endurance.   Increased bone density.   Weight control.   Increased muscle strength.   Decreased body fat.   Improvement of the body's use of insulin, a hormone.   Increased insulin sensitivity.   Reduction of insulin needs.   Reduced stress and tension.   Helps you feel better.  People with diabetes who add exercise to their lifestyle gain additional benefits, including:  Weight loss.   Reduced appetite.   Improvement of the body's use of blood glucose.   Decreased risk factors for heart disease:   Lowering of cholesterol and triglycerides.   Raising the level of good cholesterol (high-density lipoproteins, HDL).   Lowering blood sugar.   Decreased blood pressure.  TYPE 1 DIABETES AND EXERCISE  Exercise will usually lower your blood glucose.   If blood glucose is greater than 240 mg/dl, check urine ketones. If ketones are present, do not exercise.   Location of the insulin injection sites may need to be adjusted with exercise. Avoid injecting insulin into areas of the body that will be exercised. For example, avoid injecting insulin into:   The arms when playing tennis.   The legs when jogging. For more information, discuss this with your caregiver.    Keep a record of:   Food intake.   Type and amount of exercise.   Expected peak times of insulin action.   Blood glucose levels.  Do this before, during, and after exercise. Review your records with your caregiver. This will help you to develop guidelines for adjusting food intake and insulin amounts.  TYPE 2 DIABETES AND EXERCISE  Regular physical activity can help control blood glucose.   Exercise is important because it may:   Increase the body's sensitivity to insulin.   Improve blood glucose control.   Exercise reduces the risk of heart disease. It decreases serum cholesterol and triglycerides. It also lowers blood pressure.   Those who take insulin or oral hypoglycemic agents should watch for signs of hypoglycemia. These signs include dizziness, shaking, sweating, chills, and confusion.   Body water is lost during exercise. It must be replaced. This will help to avoid loss of body fluids (dehydration) or heat stroke.  Be sure to talk to your caregiver before starting an exercise program to make sure it is safe for you. Remember, any activity is better than none.  Document Released: 10/07/2003 Document Revised: 03/29/2011 Document Reviewed: 01/21/2009 [facility] Patient Information 2012 [place], [place].

## 2011-09-29 NOTE — Progress Notes (Signed)
  Subjective:    Patient ID: Roy Esparza, male    DOB: Oct 07, 1985, 26 y.o.   MRN: [id number]  HPI   This 26 y.o. Cauc male is here for follow-up after recent treatment for severe peri-orbital  cellulitis less than 2 weeks ago. He is taking Generic Augmentin and is much improved with complete resolution  of peri-orbital redness and edema. He is establishing care here for medical management of Asthma and Allergic Rhinitis. He needs med RFs to maintain adequate control of these problems. He is very interested in   long-term weight  loss management (Fam Hx + for DM and Obesity).    Review of Systems  Constitutional: Negative for fever, chills and fatigue.  HENT: Negative.   Eyes: Negative for photophobia, pain, discharge and visual disturbance.       Peri-orbital swelling is much less   Respiratory: Positive for cough. Negative for chest tightness and wheezing.   Neurological: Negative for headaches.  All other systems reviewed and are negative.        Objective:   Physical Exam  Vitals reviewed. Constitutional: He is oriented to person, place, and time. He appears well-developed and well-nourished. No distress.  HENT:  Head: Normocephalic and atraumatic.  Right Ear: External ear normal.  Left Ear: External ear normal.  Nose: Nose normal.  Mouth/Throat: Oropharynx is clear and moist.  Eyes: Conjunctivae and EOM are normal. Pupils are equal, round, and reactive to light. No scleral icterus.  Neck: Normal range of motion. Neck supple.  Cardiovascular: Normal rate.   Pulmonary/Chest: Effort normal and breath sounds normal. No respiratory distress. He has no wheezes.  Lymphadenopathy:    He has no cervical adenopathy.  Neurological: He is alert and oriented to person, place, and time.  Skin: Skin is warm and dry.       Nose: right side on bridge- 1.5 mm red pustule with surrounding erythema  Psychiatric: He has a normal mood and affect. His behavior is normal. Thought content  normal.          Assessment & Plan:   1. Cellulitis and abscess                         Finish Augmentin; RTC sooner if lesion worsens  2. Asthma with allergic rhinitis                RFs MDIs ( Albuterol, Advair); RX: Cetirizine 10 mg  1 tab hs  3. Obesity (BMI 30-39.9)                         Continue recent efforts at weight loss; schedule CPE

## 2011-10-01 LAB — CULTURE, BLOOD (ROUTINE X 2)
Culture  Setup Time: 201302251852
Culture: NO GROWTH
Culture: NO GROWTH

## 2011-10-03 ENCOUNTER — Other Ambulatory Visit: Payer: Self-pay | Admitting: Physician Assistant

## 2011-10-03 MED ORDER — ALBUTEROL SULFATE HFA 108 (90 BASE) MCG/ACT IN AERS: 2.0000 | INHALATION_SPRAY | Freq: Four times a day (QID) | RESPIRATORY_TRACT | Status: DC | PRN

## 2011-10-31 ENCOUNTER — Ambulatory Visit: Payer: 59 | Admitting: Family Medicine

## 2011-11-07 ENCOUNTER — Ambulatory Visit (INDEPENDENT_AMBULATORY_CARE_PROVIDER_SITE_OTHER): Payer: 59 | Admitting: Family Medicine

## 2011-11-07 ENCOUNTER — Encounter: Payer: Self-pay | Admitting: Family Medicine

## 2011-11-07 VITALS — BP 123/88 | HR 66 | Temp 99.1°F | Resp 16 | Ht 71.0 in | Wt 264.4 lb

## 2011-11-07 DIAGNOSIS — E669 Obesity, unspecified: Secondary | ICD-10-CM

## 2011-11-07 DIAGNOSIS — Z0000 Encounter for general adult medical examination without abnormal findings: Secondary | ICD-10-CM

## 2011-11-07 MED ORDER — ALBUTEROL SULFATE HFA 108 (90 BASE) MCG/ACT IN AERS: 2.0000 | INHALATION_SPRAY | Freq: Four times a day (QID) | RESPIRATORY_TRACT | Status: DC | PRN

## 2011-11-07 NOTE — Progress Notes (Signed)
  Subjective:    Patient ID: Roy Esparza, male    DOB: August 13, 1985, 26 y.o.   MRN: [id number]  HPI This 26 y.o. Cauc male is here for CPE. He is very interested in weight reduction and smoking cessation.  He smokes only when in social situations with his roommate. He has Asthma and allergies and is aware of  the health consequences of continuing this habit. He has had no exacerbations recently.      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    Review of Systems  Constitutional: Negative.   HENT: Negative.   Eyes: Negative.   Respiratory: Positive for cough. Negative for chest tightness, shortness of breath and wheezing.   Cardiovascular: Negative.   Gastrointestinal: Negative.   Genitourinary: Negative.   Musculoskeletal: Negative.   Skin: Negative.   Psychiatric/Behavioral: Negative.        Objective:   Physical Exam  Nursing note and vitals reviewed. Constitutional: He is oriented to person, place, and time. He appears well-developed and well-nourished. No distress.  HENT:  Head: Normocephalic and atraumatic.  Right Ear: External ear normal.  Left Ear: External ear normal.  Nose: Nose normal.  Mouth/Throat: Oropharynx is clear and moist.  Eyes: Conjunctivae and EOM are normal. Pupils are equal, round, and reactive to light. No scleral icterus.  Neck: Normal range of motion. Neck supple. No thyromegaly present.  Cardiovascular: Normal rate, regular rhythm, normal heart sounds and intact distal pulses.  Exam reveals no gallop.   No murmur heard. Pulmonary/Chest: Effort normal and breath sounds normal. No respiratory distress. He has no wheezes.  Abdominal: Soft. Bowel sounds are normal. He exhibits no distension and no mass. There is no tenderness. There is no guarding.  Genitourinary: Testes normal and penis normal. Right testis shows no mass and no swelling. Left testis shows no mass, no swelling and no tenderness.  Musculoskeletal: Normal range of  motion.  Lymphadenopathy:    He has no cervical adenopathy.       Right: No inguinal adenopathy present.       Left: No inguinal adenopathy present.  Neurological: He is alert and oriented to person, place, and time. He has normal reflexes. No cranial nerve deficit. Coordination normal.  Skin: Skin is warm and dry.  Psychiatric: He has a normal mood and affect. His behavior is normal. Judgment and thought content normal.          Assessment & Plan:   1. Obesity (BMI 30-39.9)  Amb ref to Medical Nutrition Therapy-MNT  2. Routine general medical examination at a health care facility    3. Allergic rhinitis  Continue current medicaitons

## 2011-11-07 NOTE — Patient Instructions (Signed)
Obesity Obesity is defined as having a body mass index (BMI) of 30 or more. To calculate your BMI divide your weight in pounds by your height in inches squared and multiply that product by 703. Major illnesses resulting from long-term obesity include:  Stroke.   Heart disease.   Diabetes.   Many cancers.   Arthritis.  Obesity also complicates recovery from many other medical problems.  CAUSES   A history of obesity in your parents.   Thyroid hormone imbalance.   Environmental factors such as excess calorie intake and physical inactivity.  TREATMENT  A healthy weight loss program includes:  A calorie restricted diet based on individual calorie needs.   Increased physical activity (exercise).  An exercise program is just as important as the right low-calorie diet.  Weight-loss medicines should be used only under the supervision of your physician. These medicines help, but only if they are used with diet and exercise programs. Medicines can have side effects including nervousness, nausea, abdominal pain, diarrhea, headache, drowsiness, and depression.  An unhealthy weight loss program includes:  Fasting.   Fad diets.   Supplements and drugs.  These choices do not succeed in long-term weight control.  HOME CARE INSTRUCTIONS  To help you make the needed dietary changes:   Exercise and perform physical activity as directed by your caregiver.   Keep a daily record of everything you eat. There are many free websites to help you with this. It may be helpful to measure your foods so you can determine if you are eating the correct portion sizes.   Use low-calorie cookbooks or take special cooking classes.   Avoid alcohol. Drink more water and drinks with no calories.   Take vitamins and supplements only as recommended by your caregiver.   Weight loss support groups, Registered Dieticians, counselors, and stress reduction education can also be very helpful.  Document Released:  08/24/2004 Document Revised: 07/06/2011 Document Reviewed: 06/23/2007 [facility] Patient Information 2012 [place], [place]      .Keeping you healthy  Get these tests  Blood pressure- Have your blood pressure checked once a year by your healthcare provider.  Normal blood pressure is 120/80.  Weight- Have your body mass index (BMI) calculated to screen for obesity.  BMI is a measure of body fat based on height and weight. You can also calculate your own BMI at https://www.west-esparza.com/.  Cholesterol- Have your cholesterol checked regularly starting at age 41, sooner may be necessary if you have diabetes, high blood pressure, if a family member developed heart diseases at an early age or if you smoke.   Chlamydia, HIV, and other sexual transmitted disease- Get screened each year until the age of 44 then within three months of each new sexual partner.  Diabetes- Have your blood sugar checked regularly if you have high blood pressure, high cholesterol, a family history of diabetes or if you are overweight.  Get these vaccines  Flu shot- Every fall.  Tetanus shot- Every 10 years.  Menactra- Single dose; prevents meningitis.  Take these steps  Don't smoke- If you do smoke, ask your healthcare provider about quitting. For tips on how to quit, go to www.smokefree.gov or call 1-800-QUIT-NOW.  Be physically active- Exercise 5 days a week for at least 30 minutes.  If you are not already physically active start slow and gradually work up to 30 minutes of moderate physical activity.  Examples of moderate activity include walking briskly, mowing the yard, dancing, swimming bicycling, etc.  Eat a  healthy diet- Eat a variety of healthy foods such as fruits, vegetables, low fat milk, low fat cheese, yogurt, lean meats, poultry, fish, beans, tofu, etc.  For more information on healthy eating, go to www.thenutritionsource.org  Drink alcohol in moderation- Limit alcohol intake two drinks or less a day.   Never drink and drive.  Dentist- Brush and floss teeth twice daily; visit your dentis twice a year.  Depression-Your emotional health is as important as your physical health.  If you're feeling down, losing interest in things you normally enjoy please talk with your healthcare provider.  Gun Safety- If you keep a gun in your home, keep it unloaded and with the safety lock on.  Bullets should be stored separately.  Helmet use- Always wear a helmet when riding a motorcycle, bicycle, rollerblading or skateboarding.  Safe sex- If you may be exposed to a sexually transmitted infection, use a condom  Seat belts- Seat bels can save your life; always wear one.  Smoke/Carbon Monoxide detectors- These detectors need to be installed on the appropriate level of your home.  Replace batteries at least once a year.  Skin Cancer- When out in the sun, cover up and use sunscreen SPF 15 or higher.  Violence- If anyone is threatening or hurting you, please tell your healthcare provider.

## 2011-12-13 ENCOUNTER — Encounter: Payer: 59 | Attending: Family Medicine | Admitting: *Deleted

## 2011-12-13 ENCOUNTER — Encounter: Payer: Self-pay | Admitting: *Deleted

## 2011-12-13 VITALS — Ht 71.0 in | Wt 260.0 lb

## 2011-12-13 DIAGNOSIS — Z713 Dietary counseling and surveillance: Secondary | ICD-10-CM | POA: Insufficient documentation

## 2011-12-13 DIAGNOSIS — E669 Obesity, unspecified: Secondary | ICD-10-CM

## 2011-12-13 NOTE — Progress Notes (Signed)
  Medical Nutrition Therapy:  Appt start time: 1000 end time:  1100.   Assessment:  Primary concerns today: obesity.   MEDICATIONS: see list   DIETARY INTAKE:  Usual eating pattern includes 2 meals and 1 snacks per day.  Everyday foods include fast food, sandwiches, salads.  Avoided foods include milk.    24-hr recall:  B ( AM): skip  Snk ( AM): none  L ( PM): salad with veggies and bacon 1 packet ranch and sandwich meat and cheese, or chicken salad drinks water; subway flatbread with [place] and provolone with honey mustard and veggies. Ate 6" for lunch and the other half for dinner with water;  Snk ( PM): chocolate candy, jolly ranchers 2-4 pieces; cashews with cheerwine D ( PM): other half of sub sandwich.  May skip  Nighttime meal after 11 pm: anything that can be picked up.  No cooking.  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  Sometimes is active on days off.  Works 3 12-hr shifts  Estimated energy needs: 2200 calories 248 g carbohydrates 165 g protein 61 g fat  Progress Towards Goal(s):  In progress.   Nutritional Diagnosis:  Fellows-3.3 Overweight/obesity As related to poor dietary choices and inactivity.  As evidenced by BMI 36.3.    Intervention:  Nutrition counseling provided.  Discussed scheduled meal times and balancing macro-nutrients at every meal.  Encouraged planning ahead for meals and making meals in advance for the coming week.  Client has very busy schedule and eats most meals out.  Encouraged grocery shopping and preparing easy meals for breakfasts and dinners.  Also encouraged regular physical activity and limiting alcohol consumption.  Monitoring/Evaluation:  Dietary intake, exercise, alcohol consumption, and body  weight in 6 week(s).

## 2011-12-13 NOTE — Patient Instructions (Addendum)
Eat breakfast every day- bagel with low fat cream cheese or peanut butter; or oatmeal; or fruit with protein Prepare hard boiled eggs on days off and eat them throughout the week If possible, prepared 1-2 easy meals on days off so can have meals available and avoid fast food nightmare Make healthy dressing choices Limit alcohol consumption to 2 drinks per occasion On days off try to incorporate some element of physical activity Stop eating by 9pm

## 2012-01-22 ENCOUNTER — Ambulatory Visit: Payer: 59 | Admitting: *Deleted

## 2012-07-15 ENCOUNTER — Other Ambulatory Visit: Payer: Self-pay | Admitting: Family Medicine

## 2012-09-16 ENCOUNTER — Ambulatory Visit: Payer: BC Managed Care – PPO | Admitting: Physician Assistant

## 2012-09-16 VITALS — BP 155/74 | HR 84 | Temp 97.9°F | Resp 18 | Ht 71.0 in | Wt 265.0 lb

## 2012-09-16 DIAGNOSIS — J029 Acute pharyngitis, unspecified: Secondary | ICD-10-CM

## 2012-09-16 DIAGNOSIS — J45909 Unspecified asthma, uncomplicated: Secondary | ICD-10-CM

## 2012-09-16 DIAGNOSIS — R062 Wheezing: Secondary | ICD-10-CM

## 2012-09-16 MED ORDER — FIRST-DUKES MOUTHWASH MT SUSP: 10.0000 mL | OROMUCOSAL | Status: DC | PRN

## 2012-09-16 MED ORDER — AMOXICILLIN 875 MG PO TABS: 875.0000 mg | ORAL_TABLET | Freq: Two times a day (BID) | ORAL | Status: DC

## 2012-09-16 NOTE — Progress Notes (Signed)
  Subjective:    Patient ID: Roy Esparza, male    DOB: 11/05/1985, 27 y.o.   MRN: [id number]  HPI 27 year old male presents with 4 day history of sore throat that has progressively worsened.  He does have a history of asthma and had a flare over the weekend due to visiting his girlfriend who has cats - he is allergic.  States he was out of town visiting her and did not have his Advair.  He used his albuterol regularly but states symptoms did not improve until he was able to fill his Advair.  Does admit his asthma flare has significantly improved and he has noticed very little wheezing since.  Denies headache, nausea, vomiting, abdominal pain, sinus pain, or otalgia. Has noticed some ear fullness. No nasal congestion or rhinorrhea.  Slight dry "tickle" cough.  Patient otherwise healthy with no other concerns today.      Review of Systems  Constitutional: Negative for fever and chills.  HENT: Positive for sore throat. Negative for ear pain, congestion, rhinorrhea, neck pain and postnasal drip.   Respiratory: Positive for cough and wheezing. Negative for shortness of breath.   Cardiovascular: Negative for chest pain.  Gastrointestinal: Negative for nausea, vomiting and abdominal pain.  Neurological: Negative for dizziness and headaches.       Objective:   Physical Exam  Constitutional: He is oriented to person, place, and time. He appears well-developed and well-nourished.  HENT:  Head: Normocephalic and atraumatic.  Right Ear: Hearing, tympanic membrane, external ear and ear canal normal.  Left Ear: Hearing, tympanic membrane, external ear and ear canal normal.  Nose: Right sinus exhibits no maxillary sinus tenderness and no frontal sinus tenderness. Left sinus exhibits no maxillary sinus tenderness and no frontal sinus tenderness.  Mouth/Throat: Uvula is midline and mucous membranes are normal. Posterior oropharyngeal erythema (1+ tonsillar swelling) present. No oropharyngeal exudate,  posterior oropharyngeal edema or tonsillar abscesses.  Eyes: Conjunctivae are normal.  Neck: Normal range of motion. Neck supple.  Cardiovascular: Normal rate, regular rhythm and normal heart sounds.   Pulmonary/Chest: Effort normal and breath sounds normal.  Lymphadenopathy:    He has no cervical adenopathy.  Neurological: He is oriented to person, place, and time.  Psychiatric: He has a normal mood and affect. His behavior is normal. Judgment and thought content normal.          Assessment & Plan:  1. Acute pharyngitis - Plan: amoxicillin (AMOXIL) 875 MG tablet, Diphenhyd-Hydrocort-Nystatin (FIRST-DUKES MOUTHWASH) SUSP  -Will go ahead and cover with amoxicillin 875 mg bid  -[facility] mouthwash prn pain. Also recommend ibuprofen tid prn pain.  2. Wheezing  -Continue Albuterol prn and Advair as directed.   -OK to send in short prednisone taper if needed, although patient's reports asthma flare much improved after leaving    girlfriend's house 3. Unspecified asthma  -Well controlled. Continue current treatment plan.

## 2013-02-14 ENCOUNTER — Telehealth: Payer: Self-pay

## 2013-02-14 MED ORDER — ALBUTEROL SULFATE HFA 108 (90 BASE) MCG/ACT IN AERS: INHALATION_SPRAY | RESPIRATORY_TRACT | Status: DC

## 2013-02-14 NOTE — Telephone Encounter (Signed)
Pended please advise.  

## 2013-02-14 NOTE — Telephone Encounter (Signed)
Rescue inhaler request, he is calling [facility] to have them call us.  Needs it to go out of town.

## 2013-02-14 NOTE — Telephone Encounter (Signed)
Sent!

## 2013-04-10 ENCOUNTER — Ambulatory Visit (INDEPENDENT_AMBULATORY_CARE_PROVIDER_SITE_OTHER): Payer: BC Managed Care – PPO | Admitting: Family Medicine

## 2013-04-10 VITALS — BP 148/68 | HR 68 | Temp 98.0°F | Resp 16 | Ht 71.0 in | Wt 270.0 lb

## 2013-04-10 DIAGNOSIS — R030 Elevated blood-pressure reading, without diagnosis of hypertension: Secondary | ICD-10-CM

## 2013-04-10 DIAGNOSIS — L02219 Cutaneous abscess of trunk, unspecified: Secondary | ICD-10-CM

## 2013-04-10 DIAGNOSIS — L03314 Cellulitis of groin: Secondary | ICD-10-CM

## 2013-04-10 DIAGNOSIS — J45909 Unspecified asthma, uncomplicated: Secondary | ICD-10-CM

## 2013-04-10 MED ORDER — MUPIROCIN 2 % EX OINT: TOPICAL_OINTMENT | Freq: Three times a day (TID) | CUTANEOUS | Status: DC

## 2013-04-10 MED ORDER — ALBUTEROL SULFATE HFA 108 (90 BASE) MCG/ACT IN AERS: INHALATION_SPRAY | RESPIRATORY_TRACT | Status: AC

## 2013-04-10 MED ORDER — DOXYCYCLINE HYCLATE 100 MG PO CAPS: 100.0000 mg | ORAL_CAPSULE | Freq: Two times a day (BID) | ORAL | Status: DC

## 2013-04-10 MED ORDER — FLUTICASONE-SALMETEROL 250-50 MCG/DOSE IN AEPB: 1.0000 | INHALATION_SPRAY | Freq: Two times a day (BID) | RESPIRATORY_TRACT | Status: AC

## 2013-04-10 NOTE — Progress Notes (Signed)
Subjective:    Patient ID: Roy Esparza, male    DOB: [birth year], 27 y.o.   MRN: [id number] Chief Complaint  Patient presents with  . Abscess    genital area  . Medication Refill    albuterol   HPI  Has been working out daily for the past 3 weeks and so developed a little sore on his penis - did drain some pus but has been having trouble leaving it alone - keeps messing with it  Mother and sister see Dr. Vassie Loll, Pulmonology for severe allergies and asthma.  Has allergies in addition to cats, smoke, environmental, perfume.  Has considered allergy shots. Would like to get a better handle on lungs and help his get lungs stronger while focusing on more preventative care.  Has been fairly well controlled on albuterol since 27 yo.  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  He is hoping to go to PA school - preferably with a job that will help him pay for this.  Past Medical History  Diagnosis Date  . Asthma   . Obesity   . Allergy    Current Outpatient Prescriptions on File Prior to Visit  Medication Sig Dispense Refill  . Multiple Vitamin (MULTI-VITAMIN PO) Take by mouth daily.      . cetirizine (ZYRTEC) 10 MG tablet Take 1 tablet (10 mg total) by mouth daily.  30 tablet  3  . Diphenhyd-Hydrocort-Nystatin (FIRST-DUKES MOUTHWASH) SUSP Use as directed 10 mLs in the mouth or throat every 2 (two) hours as needed. Use 1:1 ratio with viscous lidocaine.  237 mL  0   No current facility-administered medications on file prior to visit.   No Known Allergies   Review of Systems  Constitutional: Negative for fever, chills, activity change and appetite change.  Respiratory: Positive for cough and wheezing.   Cardiovascular: Negative for leg swelling.  Gastrointestinal: Negative for nausea, vomiting, abdominal pain, diarrhea and constipation.    Genitourinary: Positive for genital sores. Negative for dysuria, discharge, scrotal swelling and testicular pain.  Musculoskeletal: Positive for myalgias. Negative for joint swelling and gait problem.  Skin: Positive for rash and wound.  Neurological: Negative for weakness and numbness.  Hematological: Negative for adenopathy. Does not bruise/bleed easily.      BP 148/68  Pulse 68  Temp(Src) 98 F (36.7 C) (Oral)  Resp 16  Ht 5\' 11"  (1.803 m)  Wt 270 lb (122.471 kg)  BMI 37.67 kg/m2  SpO2 99% Objective:   Physical Exam  Constitutional: He is oriented to person, place, and time. He appears well-developed and well-nourished. No distress.  HENT:  Head: Normocephalic and atraumatic.  Eyes: Conjunctivae are normal. Pupils are equal, round, and reactive to light. No scleral icterus.  Neck: Normal range of motion. Neck supple. No thyromegaly present.  Cardiovascular: Normal rate, regular rhythm, normal heart sounds and intact distal pulses.   Pulmonary/Chest: Effort normal and breath sounds normal. No respiratory distress.  Genitourinary: Testes normal.    Cremasteric reflex is present. Circumcised. No phimosis, penile erythema or penile tenderness. No discharge found.  Small eraser-sized papule at 3 o'clock in mid of shaft, no fluctuance, warmth, or tenderness, small amount of erythema streaking towards base of penis.    Musculoskeletal: He exhibits no edema.  Lymphadenopathy:    He has no cervical adenopathy.  Neurological:  He is alert and oriented to person, place, and time.  Skin: Skin is warm and dry. He is not diaphoretic.  Psychiatric: He has a normal mood and affect. His behavior is normal.          Assessment & Plan:  Elevated blood pressure  Asthma with allergic rhinitis - Plan: Ambulatory referral to Pulmonology, meds refilled.  Cellulitis of groin - topical heat/warmp compresses tid followed by bactroban.  Start oral doxycycline due to length of lesion and  concern about erythema spreading proximally.  Meds ordered this encounter  Medications  . albuterol (PROAIR HFA) 108 (90 BASE) MCG/ACT inhaler    Sig: INHALE 1-2 PUFFS INTO THE LUNGS EVERY 6 (SIX) HOURS AS NEEDED FOR WHEEZING.    Dispense:  8.5 each    Refill:  12  . Fluticasone-Salmeterol (ADVAIR) 250-50 MCG/DOSE AEPB    Sig: Inhale 1 puff into the lungs every 12 (twelve) hours.    Dispense:  60 each    Refill:  5  . doxycycline (VIBRAMYCIN) 100 MG capsule    Sig: Take 1 capsule (100 mg total) by mouth 2 (two) times daily.    Dispense:  20 capsule    Refill:  0  . mupirocin ointment (BACTROBAN) 2 %    Sig: Apply topically 3 (three) times daily.    Dispense:  22 g    Refill:  1

## 2013-04-10 NOTE — Patient Instructions (Signed)
Apply heat three times a day followed by topical Bactroban.

## 2013-05-15 ENCOUNTER — Institutional Professional Consult (permissible substitution): Payer: 59 | Admitting: Pulmonary Disease

## 2013-05-16 ENCOUNTER — Ambulatory Visit (INDEPENDENT_AMBULATORY_CARE_PROVIDER_SITE_OTHER): Payer: BC Managed Care – PPO | Admitting: Pulmonary Disease

## 2013-05-16 ENCOUNTER — Encounter: Payer: Self-pay | Admitting: Pulmonary Disease

## 2013-05-16 VITALS — BP 136/72 | HR 69 | Temp 98.4°F | Ht 71.0 in | Wt 280.6 lb

## 2013-05-16 DIAGNOSIS — G4733 Obstructive sleep apnea (adult) (pediatric): Secondary | ICD-10-CM

## 2013-05-16 DIAGNOSIS — J45909 Unspecified asthma, uncomplicated: Secondary | ICD-10-CM

## 2013-05-16 NOTE — Assessment & Plan Note (Signed)
Given excessive daytime somnolence, narrow pharyngeal exam, witnessed apneas & loud snoring, obstructive sleep apnea is very likely & an overnight polysomnogram will be scheduled as a home study. The pathophysiology of obstructive sleep apnea , it's cardiovascular consequences & modes of treatment including CPAP were discused with the patient in detail & they evidenced understanding.  

## 2013-05-16 NOTE — Patient Instructions (Signed)
Asthma appears controlled on present regimen Allergy testing to clarify Home sleep testing Stay on advair once daily & albuterol as needed

## 2013-05-16 NOTE — Assessment & Plan Note (Signed)
Asthma appears controlled on present regimen Allergy testing to clarify Stay on advair once daily & albuterol as needed

## 2013-05-16 NOTE — Progress Notes (Signed)
Subjective:    Patient ID: Roy Esparza, male    DOB: July 16, 1986, 27 y.o.   MRN: [id number]  HPI  27 year old smoker presents for evaluation of asthma. Asthma was diagnosed at age 76, and he attributes this predominantly to allergies especially cats. He has been taking Advair on and off since age 63 and about once a day for the last 2 years. He has required albuterol her about once daily. He denies nocturnal cough or wheeze. He takes Zyrtec in spring He admits to smoking about a pack per month and reports cough about 30 minutes after a cigarette. He works as a Designer, industrial/product at [employer] and denies any occupational symptoms. He desires to establish pulmonary care for his asthma so that he can be held accountable for his asthma control. He is thinking of moving to [place] with his girlfriend who has cats.  Spirometry shows FEV1 of 3.88-84% and FVC of 5.07-90% with a ratio of 77. He did not take his bronchodilator this a.m..  He reports loud snoring as a child and is working on his weight loss. He denies excessive daytime somnolence or witnessed apneas or gasping episodes in the sleep Epworth sleepiness score is 5/24. Bedtime is 11 PM, sleep latency is minimal, he has no nocturnal awakenings and is out of bed at 6 AM feeling tired occasionally without headaches or dryness of mouth. He is gained 40 pounds over the last 2 years and has started an exercise regimen  There is no history suggestive of cataplexy, sleep paralysis or parasomnias    Past Medical History  Diagnosis Date  . Asthma   . Obesity   . Allergy     No past surgical history on file.  No Known Allergies  History   Social History  . Marital Status: Single    Spouse Name: N/A    Number of Children: 0  . Years of Education: N/A   Occupational History  . lab tech    Social History Main Topics  . Smoking status: Current Every Day Smoker -- 0.03 packs/day for 3 years    Types: Cigarettes  . Smokeless tobacco: Not  on file     Comment: smokes 1 pack per month sometimes less. does not smoke daily 05/06/13  . Alcohol Use: Yes     Comment: social drinker - beer and sometimes alcohol  . Drug Use: No  . Sexual Activity: Yes   Other Topics Concern  . Not on file   Social History Narrative  . No narrative on file    Family History  Problem Relation Age of Onset  . Diabetes Mother     Type II DM  . Hypertension Mother   . Obesity Mother   . Heart failure Father   . Heart disease Father   . Asthma Sister   . Allergic rhinitis Sister      Review of Systems  Constitutional: Negative for fever and unexpected weight change.  HENT: Negative for congestion, dental problem, ear pain, nosebleeds, postnasal drip, rhinorrhea, sinus pressure, sneezing, sore throat and trouble swallowing.   Eyes: Negative for redness and itching.  Respiratory: Positive for chest tightness. Negative for cough, shortness of breath and wheezing.   Cardiovascular: Negative for palpitations and leg swelling.  Gastrointestinal: Negative for nausea and vomiting.  Genitourinary: Negative for dysuria.  Musculoskeletal: Negative for joint swelling.  Skin: Negative for rash.  Neurological: Negative for headaches.  Hematological: Does not bruise/bleed easily.  Psychiatric/Behavioral: Negative for dysphoric mood.  The patient is not nervous/anxious.        Objective:   Physical Exam  Gen. Pleasant, obese, in no distress, normal affect ENT - no lesions, no post nasal drip, class 2 airway Neck: No JVD, no thyromegaly, no carotid bruits Lungs: no use of accessory muscles, no dullness to percussion, decreased without rales or rhonchi  Cardiovascular: Rhythm regular, heart sounds  normal, no murmurs or gallops, no peripheral edema Abdomen: soft and non-tender, no hepatosplenomegaly, BS normal. Musculoskeletal: No deformities, no cyanosis or clubbing Neuro:  alert, non focal, no tremors       Assessment & Plan:

## 2013-06-12 ENCOUNTER — Telehealth: Payer: Self-pay | Admitting: Pulmonary Disease

## 2013-06-16 NOTE — Telephone Encounter (Signed)
[employer] approved the HST. When I contacted the patient to arrange HST, pt stated that he was moving out of town/state and would be getting a different insurance. Pt stated to let Dr. Vassie Loll know that he would be moving, therefore would not be doing the home sleep study. Roy Esparza Will forward this message to Dr. Vassie Loll as [name]. Rhonda J Esparza

## 2013-06-16 NOTE — Telephone Encounter (Signed)
Pt aware.

## 2013-06-16 NOTE — Telephone Encounter (Signed)
Ok - pl advise him to get the study wherever he goes. He likely does have OSA

## 2013-06-22 ENCOUNTER — Ambulatory Visit (INDEPENDENT_AMBULATORY_CARE_PROVIDER_SITE_OTHER): Payer: BC Managed Care – PPO | Admitting: Emergency Medicine

## 2013-06-22 VITALS — BP 120/82 | HR 78 | Temp 99.1°F | Resp 20 | Ht 70.75 in | Wt 271.0 lb

## 2013-06-22 DIAGNOSIS — J45909 Unspecified asthma, uncomplicated: Secondary | ICD-10-CM

## 2013-06-22 DIAGNOSIS — R05 Cough: Secondary | ICD-10-CM

## 2013-06-22 MED ORDER — ALBUTEROL SULFATE HFA 108 (90 BASE) MCG/ACT IN AERS
2.0000 | INHALATION_SPRAY | RESPIRATORY_TRACT | Status: AC | PRN
Start: 2013-06-22 — End: ?

## 2013-06-22 MED ORDER — FLUTICASONE-SALMETEROL 250-50 MCG/DOSE IN AEPB: 1.0000 | INHALATION_SPRAY | Freq: Two times a day (BID) | RESPIRATORY_TRACT | Status: DC

## 2013-06-22 MED ORDER — ALBUTEROL SULFATE (2.5 MG/3ML) 0.083% IN NEBU
2.5000 mg | INHALATION_SOLUTION | Freq: Every day | RESPIRATORY_TRACT | Status: AC
  Administered 2013-06-22: 2.5 mg via RESPIRATORY_TRACT

## 2013-06-22 MED ORDER — IPRATROPIUM BROMIDE 0.02 % IN SOLN
0.5000 mg | Freq: Once | RESPIRATORY_TRACT | Status: AC
  Administered 2013-06-22: 0.5 mg via RESPIRATORY_TRACT

## 2013-06-22 MED ORDER — ALBUTEROL SULFATE (2.5 MG/3ML) 0.083% IN NEBU
2.5000 mg | INHALATION_SOLUTION | Freq: Once | RESPIRATORY_TRACT | Status: AC
  Administered 2013-06-22: 2.5 mg via RESPIRATORY_TRACT

## 2013-06-22 NOTE — Progress Notes (Signed)
[facility] [address], [place] [address] [phone]  Date:  06/22/2013   Name:  Roy Esparza   DOB:  1986-03-18   MRN:  [id number]  PCP:  Dow Adolph, MD    Chief Complaint: Asthma   History of Present Illness:  Roy Esparza is a 27 y.o. very pleasant male patient who presents with the following:  Moved to [place] into a new (to him) apartment.  He is convinced the apartment is contaminated with some allergen.  He is tight and wheezing and overusing his inhalers.  No fever or chills.  Scant cough and is not productive.  Says wheezing.  No rhinorrhea or ocular symptoms.  No improvement with over the counter medications or other home remedies. Denies other complaint or health concern today.   No improvement with over the counter medications or other home remedies. Denies other complaint or health concern today.   Patient Active Problem List   Diagnosis Date Noted  . OSA (obstructive sleep apnea) 05/16/2013  . Asthma with allergic rhinitis 09/29/2011  . Obesity (BMI 30-39.9) 09/29/2011    Past Medical History  Diagnosis Date  . Asthma   . Obesity   . Allergy     History reviewed. No pertinent past surgical history.  History  Substance Use Topics  . Smoking status: Current Every Day Smoker -- 0.03 packs/day for 3 years    Types: Cigarettes  . Smokeless tobacco: Not on file     Comment: smokes 1 pack per month sometimes less. does not smoke daily 05/06/13  . Alcohol Use: Yes     Comment: social drinker - beer and sometimes alcohol    Family History  Problem Relation Age of Onset  . Diabetes Mother     Type II DM  . Hypertension Mother   . Obesity Mother   . Heart failure Father   . Heart disease Father   . Asthma Sister   . Allergic rhinitis Sister     No Known Allergies  Medication list has been reviewed and updated.  Current Outpatient Prescriptions on File Prior to Visit  Medication Sig Dispense Refill  . albuterol  (PROAIR HFA) 108 (90 BASE) MCG/ACT inhaler INHALE 1-2 PUFFS INTO THE LUNGS EVERY 6 (SIX) HOURS AS NEEDED FOR WHEEZING.  8.5 each  12  . Fluticasone-Salmeterol (ADVAIR) 250-50 MCG/DOSE AEPB Inhale 1 puff into the lungs every 12 (twelve) hours.  60 each  5  . Multiple Vitamin (MULTI-VITAMIN PO) Take by mouth daily.      . cetirizine (ZYRTEC) 10 MG tablet Take 1 tablet (10 mg total) by mouth daily.  30 tablet  3   No current facility-administered medications on file prior to visit.    Review of Systems:  As per HPI, otherwise negative.    Physical Examination: Filed Vitals:   06/22/13 1337  BP: 120/82  Pulse: 78  Temp: 99.1 F (37.3 C)  Resp: 20   Filed Vitals:   06/22/13 1337  Height: 5' 10.75" (1.797 m)  Weight: 271 lb (122.925 kg)   Body mass index is 38.07 kg/(m^2). Ideal Body Weight: Weight in (lb) to have BMI = 25: 177.6  GEN: obese, NAD, Non-toxic, A & O x 3 HEENT: Atraumatic, Normocephalic. Neck supple. No masses, No LAD. Ears and Nose: No external deformity. CV: RRR, No M/G/R. No JVD. No thrill. No extra heart sounds. PULM: CTA B, bilateral wheezes, no crackles, rhonchi. No retractions. No resp. distress. No accessory  muscle use. ABD: S, NT, ND, +BS. No rebound. No HSM. EXTR: No c/c/e NEURO Normal gait.  PSYCH: Normally interactive. Conversant. Not depressed or anxious appearing.  Calm demeanor.    Assessment and Plan: Asthma Refill MDI Neb  Signed,  Phillips Odor, MD

## 2013-06-22 NOTE — Patient Instructions (Signed)
Asthma, Adult Asthma is a recurring condition in which the airways tighten and narrow. Asthma can make it difficult to breathe. It can cause coughing, wheezing, and shortness of breath. Asthma episodes (also called asthma attacks) range from minor to life-threatening. Asthma cannot be cured, but medicines and lifestyle changes can help control it. CAUSES Asthma is believed to be caused by inherited (genetic) and environmental factors, but its exact cause is unknown. Asthma may be triggered by allergens, lung infections, or irritants in the air. Asthma triggers are different for each person. Common triggers include:   Animal dander.  Dust mites.  Cockroaches.  Pollen from trees or grass.  Mold.  Smoke.  Air pollutants such as dust, household cleaners, hair sprays, aerosol sprays, paint fumes, strong chemicals, or strong odors.  Cold air, weather changes, and winds (which increase molds and pollens in the air).  Strong emotional expressions such as crying or laughing hard.  Stress.  Certain medicines (such as aspirin) or types of drugs (such as beta-blockers).  Sulfites in foods and drinks. Foods and drinks that may contain sulfites include dried fruit, potato chips, and sparkling grape juice.  Infections or inflammatory conditions such as the flu, a cold, or an inflammation of the nasal membranes (rhinitis).  Gastroesophageal reflux disease (GERD).  Exercise or strenuous activity. SYMPTOMS Symptoms may occur immediately after asthma is triggered or many hours later. Symptoms include:  Wheezing.  Excessive nighttime or early morning coughing.  Frequent or severe coughing with a common cold.  Chest tightness.  Shortness of breath. DIAGNOSIS  The diagnosis of asthma is made by a review of your medical history and a physical exam. Tests may also be performed. These may include:  Lung function studies. These tests show how much air you breath in and out.  Allergy  tests.  Imaging tests such as X-rays. TREATMENT  Asthma cannot be cured, but it can usually be controlled. Treatment involves identifying and avoiding your asthma triggers. It also involves medicines. There are 2 classes of medicine used for asthma treatment:   Controller medicines. These prevent asthma symptoms from occurring. They are usually taken every day.  Reliever or rescue medicines. These quickly relieve asthma symptoms. They are used as needed and provide short-term relief. Your health care provider will help you create an asthma action plan. An asthma action plan is a written plan for managing and treating your asthma attacks. It includes a list of your asthma triggers and how they may be avoided. It also includes information on when medicines should be taken and when their dosage should be changed. An action plan may also involve the use of a device called a peak flow meter. A peak flow meter measures how well the lungs are working. It helps you monitor your condition. HOME CARE INSTRUCTIONS   Take medicine as directed by your health care provider. Speak with your health care provider if you have questions about how or when to take the medicines.  Use a peak flow meter as directed by your health care provider. Record and keep track of readings.  Understand and use the action plan to help minimize or stop an asthma attack without needing to seek medical care.  Control your home environment in the following ways to help prevent asthma attacks:  Do not smoke. Avoid being exposed to secondhand smoke.  Change your heating and air conditioning filter regularly.  Limit your use of fireplaces and wood stoves.  Get rid of pests (such as roaches and   mice) and their droppings.  Throw away plants if you see mold on them.  Clean your floors and dust regularly. Use unscented cleaning products.  Try to have someone else vacuum for you regularly. Stay out of rooms while they are being  vacuumed and for a short while afterward. If you vacuum, use a dust mask from a hardware store, a double-layered or microfilter vacuum cleaner bag, or a vacuum cleaner with a HEPA filter.  Replace carpet with wood, tile, or vinyl flooring. Carpet can trap dander and dust.  Use allergy-proof pillows, mattress covers, and box spring covers.  Wash bed sheets and blankets every week in hot water and dry them in a dryer.  Use blankets that are made of polyester or cotton.  Clean bathrooms and kitchens with bleach. If possible, have someone repaint the walls in these rooms with mold-resistant paint. Keep out of the rooms that are being cleaned and painted.  Wash hands frequently. SEEK MEDICAL CARE IF:   You have wheezing, shortness of breath, or a cough even if taking medicine to prevent attacks.  The colored mucus you cough up (sputum) is thicker than usual.  Your sputum changes from clear or white to yellow, green, gray, or bloody.  You have any problems that may be related to the medicines you are taking (such as a rash, itching, swelling, or trouble breathing).  You are using a reliever medicine more than 2 3 times per week.  Your peak flow is still at 50 79% of you personal best after following your action plan for 1 hour. SEEK IMMEDIATE MEDICAL CARE IF:   You seem to be getting worse and are unresponsive to treatment during an asthma attack.  You are short of breath even at rest.  You get short of breath when doing very little physical activity.  You have difficulty eating, drinking, or talking due to asthma symptoms.  You develop chest pain.  You develop a fast heartbeat.  You have a bluish color to your lips or fingernails.  You are lightheaded, dizzy, or faint.  Your peak flow is less than 50% of your personal best.  You have a fever or persistent symptoms for more than 2 3 days.  You have a fever and symptoms suddenly get worse. MAKE SURE YOU:   Understand these  instructions.  Will watch your condition.  Will get help right away if you are not doing well or get worse. Document Released: 07/17/2005 Document Revised: 03/19/2013 Document Reviewed: 02/13/2013 Exit[employer] Patient Information 2014 [employer].  

## 2013-07-04 ENCOUNTER — Ambulatory Visit: Payer: BC Managed Care – PPO | Admitting: Pulmonary Disease

## 2013-07-11 ENCOUNTER — Institutional Professional Consult (permissible substitution): Payer: BC Managed Care – PPO | Admitting: Internal Medicine

## 2014-04-13 ENCOUNTER — Other Ambulatory Visit: Payer: Self-pay | Admitting: Family Medicine

## 2014-04-16 ENCOUNTER — Other Ambulatory Visit: Payer: Self-pay | Admitting: *Deleted

## 2014-04-22 ENCOUNTER — Telehealth: Payer: Self-pay

## 2014-04-22 MED ORDER — FLUTICASONE-SALMETEROL 250-50 MCG/DOSE IN AEPB: 1.0000 | INHALATION_SPRAY | Freq: Two times a day (BID) | RESPIRATORY_TRACT | Status: AC

## 2014-04-22 MED ORDER — ALBUTEROL SULFATE HFA 108 (90 BASE) MCG/ACT IN AERS: INHALATION_SPRAY | RESPIRATORY_TRACT | Status: AC

## 2014-04-22 NOTE — Telephone Encounter (Signed)
LM to advise pt. 

## 2014-04-22 NOTE — Telephone Encounter (Signed)
Received a fax from [facility] for Advair refill. Pt has not been seen since 05/2013.  When advised that he needed an OV pt states he does not need to come in for this- per phone message.  LM for rtn call to schedule appt or any further questions.

## 2014-04-22 NOTE — Telephone Encounter (Signed)
Needs OV for additional refills.  Meds ordered this encounter  Medications  . albuterol (PROAIR HFA) 108 (90 BASE) MCG/ACT inhaler    Sig: Inhale 1 or 2 puffs every 6 hrs as needed for wheezing. PATIENT NEEDS OFFICE VISIT FOR ADDITIONAL REFILLS    Dispense:  8.5 g    Refill:  0    Order Specific Question:  Supervising Provider    Answer:  DOOLITTLE, ROBERT P [[id number]]  . Fluticasone-Salmeterol (ADVAIR DISKUS) 250-50 MCG/DOSE AEPB    Sig: Inhale 1 puff into the lungs 2 (two) times daily. Need office visit for additional refills.    Dispense:  1 each    Refill:  0    Order Specific Question:  Supervising Provider    Answer:  DOOLITTLE, ROBERT P [[id number]]

## 2014-04-22 NOTE — Telephone Encounter (Signed)
Patient states he needs a refill on his inhaler. Patient's last OV was 05/2013 and I advised patient he needs an OV to have this refilled. Patient disagreed. Please send medication refill to [facility] in [place], [place].  [phone]

## 2023-04-30 ENCOUNTER — Ambulatory Visit: Payer: BC Managed Care – PPO | Admitting: Psychology

## 2023-04-30 DIAGNOSIS — F4322 Adjustment disorder with anxiety: Secondary | ICD-10-CM | POA: Diagnosis not present

## 2023-04-30 NOTE — Progress Notes (Unsigned)
[facility] Counselor Initial Adult Exam  Name: Roy Esparza Date: 04/30/2023 MRN: [id number] DOB: 1986-07-07 PCP: Roy March, MD (Inactive)  Time Spent: 3:05  pm - 4:00 pm: 55 Minutes   Roy Esparza participated from home, via video, and consented to treatment. Therapist participated from home office. We met online due to COVID pandemic.   Guardian/Payee:  N/A    Paperwork requested: {WUJ:81191}  Reason for Visit /Presenting Problem: ***  Mental Status Exam: Appearance:   {PSY:22683}     Behavior:  {PSY:21022743}  Motor:  {PSY:22302}  Speech/Language:   {PSY:22685}  Affect:  {PSY:22687}  Mood:  {PSY:31886}  Thought process:  {PSY:31888}  Thought content:    {[phone]}  Sensory/Perceptual disturbances:    {[phone]}  Orientation:  {PSY:30297}  Attention:  {PSY:22877}  Concentration:  {PSY:782 552 9297}  Memory:  {PSY:231-098-5260}  Fund of knowledge:   {PSY:782 552 9297}  Insight:    {PSY:782 552 9297}  Judgment:   {PSY:782 552 9297}  Impulse Control:  {PSY:782 552 9297}   Appearance: {PSY:22683}    Behavior: {PSY:21022743} Motor: {PSY:22302} Speech/Language: {PSY:22685} Affect: {PSY:22687} Mood: {PSY:31886} Thought process: {PSY:31888} Thought content: {[phone]} Sensory/Perceptual disturbances: {[phone]} Orientation: {PSY:30297} Attention: {PSY:22877} Concentration: {PSY:782 552 9297} Memory: {PSY:231-098-5260} Fund of knowledge: {PSY:782 552 9297} Insight: {PSY:782 552 9297} Judgment: {PSY:782 552 9297} Impulse Control: {PSY:782 552 9297}  Reported Symptoms:  ***  Risk Assessment: Danger to Self:  {PSY:22692} Self-injurious Behavior: {PSY:22692} Danger to Others: {PSY:22692} Duty to Warn:{PSY:311194} Physical Aggression / Violence:{PSY:21197} Access to Firearms a concern: {PSY:21197} Gang Involvement:{PSY:21197} Patient / guardian was educated about steps to take if suicide or homicide  risk level increases between visits: {Yes/No-Ex:120004} While future psychiatric events cannot be accurately predicted, the patient does not currently require acute inpatient psychiatric care and does not currently meet [facility] involuntary commitment criteria.  Substance Abuse History: Current substance abuse: {PSY:21197}    Past Psychiatric History:   {Past psych history:20559} Outpatient Providers:*** History of Psych Hospitalization: {PSY:21197} Psychological Testing: {PSY:21014032}   Abuse History:  Victim of: {Abuse History:314532}, {Type of abuse:20566}   Report needed: {PSY:314532} Victim of Neglect:{yes no:314532} Perpetrator of {PSY:20566}  Witness / Exposure to Domestic Violence: {PSY:21197}  Protective Services Involvement: {PSY:21197} Witness to [employer] Violence:  {PSY:21197}  Family History:  Family History  Problem Relation Age of Onset   Diabetes Mother        Type II DM   Hypertension Mother    Obesity Mother    Heart failure Father    Heart disease Father    Asthma Sister    Allergic rhinitis Sister     Living situation: the patient {lives:315711::"lives with their family"}  Sexual Orientation: {Sexual Orientation:[phone]}  Relationship Status: {Desc; marital status:62}  Name of spouse / other:*** If a parent, number of children / ages:***  Support Systems: {DIABETES SUPPORT:20310}  Financial Stress:  {YES/NO:21197}  Income/Employment/Disability: Manufacturing engineer: [employer]  Educational History: Education: {PSY :31912}  Religion/Sprituality/World View: {CHL AMB RELIGION/SPIRITUALITY:[phone]}  Any cultural differences that may affect / interfere with treatment:  {Religious/Cultural:200019}  Recreation/Hobbies: {Woc hobbies:30428}  Stressors: {PATIENT STRESSORS:22669}  Strengths: {Patient Coping Strengths:3085531434}  Barriers:  ***   Legal History: Pending legal issue / charges:  {PSY:20588} History of legal issue / charges: {Legal Issues:[id number]}  Medical History/Surgical History: {Desc; reviewed/not reviewed:60074} Past Medical History:  Diagnosis Date   Allergy    Asthma    Obesity     No past surgical history on file.  Medications: Current Outpatient Medications  Medication Sig Dispense Refill   albuterol (PROAIR HFA) 108 (90 BASE) MCG/ACT inhaler INHALE 1-2 PUFFS INTO THE LUNGS EVERY 6 (SIX) HOURS AS NEEDED FOR WHEEZING. 8.5 each 12   albuterol (PROAIR HFA) 108 (90 BASE) MCG/ACT inhaler Inhale 1 or 2 puffs every 6 hrs as needed for wheezing. PATIENT NEEDS OFFICE VISIT FOR ADDITIONAL REFILLS 8.5 g 0   albuterol (PROVENTIL HFA;VENTOLIN HFA) 108 (90 BASE) MCG/ACT inhaler Inhale 2 puffs into the lungs every 4 (four) hours as needed for wheezing or shortness of breath (cough, shortness of breath or wheezing.). 1 Inhaler 12   cetirizine (ZYRTEC) 10 MG tablet Take 1 tablet (10 mg total) by mouth daily. 30 tablet 3   Fluticasone-Salmeterol (ADVAIR DISKUS) 250-50 MCG/DOSE AEPB Inhale 1 puff into the lungs 2 (two) times daily. Need office visit for additional refills. 1 each 0   Fluticasone-Salmeterol (ADVAIR) 250-50 MCG/DOSE AEPB Inhale 1 puff into the lungs every 12 (twelve) hours. 60 each 5   Multiple Vitamin (MULTI-VITAMIN PO) Take by mouth daily.     Current Facility-Administered Medications  Medication Dose Route Frequency Provider Last Rate Last Admin   albuterol (PROVENTIL) (2.5 MG/3ML) 0.083% nebulizer solution 2.5 mg  2.5 mg Nebulization Daily Roy Dane, MD   2.5 mg at 06/22/13 1409    No Known Allergies  Initial note: Patient is self referred. He had worked for [employer] for 12 years.Married for 2 years and has a 68 month old. Also moved to town 2 years ago. He wants to talk about navigating a healthy relationship. Wife caught him cheating and he says he wants to stay married. He says he has never "not cheated". Says he genuinely loves his wife  and wants to work on why he lies. He says they are both [place] and share spiritual views and values. Roy Esparza says both his wife and mother are both "strong willed individuals". He does think that marriage counseling will be useful, but he wants to work on what he brings to the table. He says he gives in to immediate gratification. This is his first marriage and he feels very committed to it being his only marriage. He states his wife will always call him on his BS, and can be confronting with what she sees. With all the change in his life, his mom also was diagnosed with uterine cancer. That was hard for him as they are close. She is now doing better.  He is from [place] and parents intact (married 37 years). His father had an MI in front of him when he was 52. 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 Moved to [place] to be with girl that he dated 8-9 years. Wife's name is Roy Esparza and she did not know about his past infidelity in relationships. He eventually went back to school and got his degree in biology.  Macen says that he tends to "recycle" relationships in that he breaks up with girlfriend, and then dates them again later. He says that he will occasionally watch You Tube clips and will follow up with porn. Has always watched porn since age 29. He went on a website for escorts and started talking to a particular individual, with no intension of meeting. Escort was in it for the money and he says he was in it for the attention. There was one particular woman, but he never met up with her. He does say that the plans were getting out of control  and going in direction of meeting up. He got intoxicated one night and the girl sent him a video. He did tell wife he had issues like this in the past. There was a couple of instances with a friend of their that would flirt with him. When he first got married, he went through a year of fearing that he made a wrong decision to get  married.  FOO: Older brother and older sister (half) both local.Wife's family in [place], but may move to [place]. Jarron is a Production designer, theatre/television/film at a [employer] for a good friend. He also door dashes in evening. Roy Esparza works as an Airline pilot from home. Her mother comes down every few months and stays a couple of months. He says that Roy Esparza is committed to relationship. She was a party girl back in the day and used a lot of substances in the past. No history of mental health disorders and no medication.  His goal is to understand his own behavior and reconcile  that behavior in the context of his spiritual life. We discussed comfort level with discussing religion and spirituality.      Diagnoses:  No diagnosis found.  Plan of Care: ***   Garrel Ridgel, PhD

## 2023-05-14 ENCOUNTER — Ambulatory Visit: Payer: BC Managed Care – PPO | Admitting: Psychology

## 2023-05-17 ENCOUNTER — Ambulatory Visit: Payer: BC Managed Care – PPO | Admitting: Psychology

## 2023-05-17 DIAGNOSIS — F4322 Adjustment disorder with anxiety: Secondary | ICD-10-CM

## 2023-05-17 NOTE — Progress Notes (Addendum)
Roy Esparza Counselor Initial Adult Exam  Name: Roy Esparza Date: 05/17/2023 MRN: [id number] DOB: [birth year] PCP: Roy March, MD (Inactive)     Roy Esparza participated from home, via video, and consented to treatment. Therapist participated from home office. We met online due to COVID pandemic.   Guardian/Payee:  N/A    Paperwork requested: No   Reason for Visit /Presenting Problem: Marital discord, anxiety, compulsive behaviors.  Mental Status Exam: Appearance:   Casual     Behavior:  Appropriate  Motor:  Normal  Speech/Language:   Clear and Coherent  Affect:  Appropriate  Mood:  anxious  Thought process:  normal  Thought content:    WNL  Sensory/Perceptual disturbances:    WNL  Orientation:  oriented to person, place, and situation  Attention:  Good  Concentration:  Good  Memory:  WNL  Fund of knowledge:   Good  Insight:    Good  Judgment:   Poor  Impulse Control:  Poor    Reported Symptoms:  anxious, worry, sadness  Risk Assessment: Danger to Self:  No Self-injurious Behavior: No Danger to Others: No Duty to Warn:no Physical Aggression / Violence:No  Access to Firearms a concern:  unknown Gang Involvement:No  Patient / guardian was educated about steps to take if suicide or homicide risk level increases between visits: n/a While future psychiatric events cannot be accurately predicted, the patient does not currently require acute inpatient psychiatric care and does not currently meet [facility] involuntary commitment criteria.  Substance Abuse History: Current substance abuse: No     Past Psychiatric History:   No previous psychological problems have been observed Outpatient Providers:N/A History of Psych Hospitalization: No  Psychological Testing:  N/A    Abuse History:  Victim of: No.,  N/A    Report needed: No. Victim of Neglect:No. Perpetrator of   N/A   Witness / Exposure to Domestic Violence: No   Protective Services Involvement: No  Witness to [employer] Violence:  No   Family History:  Family History  Problem Relation Age of Onset   Diabetes Mother        Type II DM   Hypertension Mother    Obesity Mother    Heart failure Father    Heart disease Father    Asthma Sister    Allergic rhinitis Sister     Living situation: the patient lives with their family  Sexual Orientation: Straight  Relationship Status: married  Name of spouse / other:Roy Esparza If a parent, number of children / ages:16 months  Support Systems: Location manager Stress:  No   Income/Employment/Disability: Employment  Financial planner:  unknown  Educational History: Education: Risk manager: Protestant  Any cultural differences that may affect / interfere with treatment:  not applicable   Recreation/Hobbies: unknown  Stressors: Marital or family conflict    Strengths: Family  Barriers:  unknown   Legal History: Pending legal issue / charges: The patient has no significant history of legal issues. History of legal issue / charges:  N/A  Medical History/Surgical History: not reviewed Past Medical History:  Diagnosis Date   Allergy    Asthma    Obesity     No past surgical history on file.  Medications: Current Outpatient Medications  Medication Sig Dispense Refill  albuterol (PROAIR HFA) 108 (90 BASE) MCG/ACT inhaler INHALE 1-2 PUFFS INTO THE LUNGS EVERY 6 (SIX) HOURS AS NEEDED FOR WHEEZING. 8.5 each 12   albuterol (PROAIR HFA) 108 (90 BASE) MCG/ACT inhaler Inhale 1 or 2 puffs every 6 hrs as needed for wheezing. PATIENT NEEDS OFFICE VISIT FOR ADDITIONAL REFILLS 8.5 g 0   albuterol (PROVENTIL HFA;VENTOLIN HFA) 108 (90 BASE) MCG/ACT inhaler Inhale 2 puffs into the lungs every 4 (four) hours as needed for wheezing or shortness of breath (cough, shortness of breath or wheezing.). 1 Inhaler 12    cetirizine (ZYRTEC) 10 MG tablet Take 1 tablet (10 mg total) by mouth daily. 30 tablet 3   Fluticasone-Salmeterol (ADVAIR DISKUS) 250-50 MCG/DOSE AEPB Inhale 1 puff into the lungs 2 (two) times daily. Need office visit for additional refills. 1 each 0   Fluticasone-Salmeterol (ADVAIR) 250-50 MCG/DOSE AEPB Inhale 1 puff into the lungs every 12 (twelve) hours. 60 each 5   Multiple Vitamin (MULTI-VITAMIN PO) Take by mouth daily.     Current Facility-Administered Medications  Medication Dose Route Frequency Provider Last Rate Last Admin   albuterol (PROVENTIL) (2.5 MG/3ML) 0.083% nebulizer solution 2.5 mg  2.5 mg Nebulization Daily Roy Dane, MD   2.5 mg at 06/22/13 1409    No Known Allergies  Initial note: Patient is self referred. He had worked for [employer] for 12 years.Married for 2 years and has a 78 month old. Also moved to town 2 years ago. He wants to talk about navigating a healthy relationship. Wife caught him cheating and he says he wants to stay married. He says he has never "not cheated". Says he genuinely loves his wife and wants to work on why he lies. He says they are both [place] and share spiritual views and values. Roy Esparza says both his wife and mother are both "strong willed individuals". He does think that marriage counseling will be useful, but he wants to work on what he brings to the table. He says he gives in to immediate gratification. This is his first marriage and he feels very committed to it being his only marriage. He states his wife will always call him on his BS, and can be confronting with what she sees. With all the change in his life, his mom also was diagnosed with uterine cancer. That was hard for him as they are close. She is now doing better.  He is from [place] and parents intact (married 37 years). His father had an MI in front of him when he was 82. 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 Moved to [place] to be with  girl that he dated 8-9 years. Wife's name is Roy Esparza and she did not know about his past infidelity in relationships. He eventually went back to school and got his degree in biology.  Roy Esparza says that he tends to "recycle" relationships in that he breaks up with girlfriend, and then dates them again later. He says that he will occasionally watch You Tube clips and will follow up with porn. Has always watched porn since age 74. He went on a website for escorts and started talking to a particular individual, with no intension of meeting. Escort was in it for the money and he says he was in it for the attention. There was one particular woman, but he never met up with her. He does say that the plans were getting out of control and going in direction of  meeting up. He got intoxicated one night and the girl sent him a video. He did tell wife he had issues like this in the past. There was a couple of instances with a friend of their that would flirt with him. When he first got married, he went through a year of fearing that he made a wrong decision to get married.  FOO: Older brother and older sister (half) both local.Wife's family in [place], but may move to [place]. Kurk is a Production designer, theatre/television/film at a [employer] for a good friend. He also door dashes in evening. Roy Esparza works as an Airline pilot from home. Her mother comes down every few months and stays a couple of months. He says that Roy Esparza is committed to relationship. She was a party girl back in the day and used a lot of substances in the past. No history of mental Esparza disorders and no medication.  His goal is to understand his own behavior and reconcile  that behavior in the context of his spiritual life. We discussed comfort level with discussing religion and spirituality.  Goals/Tx. Plan: Treatment will involve outpatient individual therapy to reduce compulsive behaviors that has created significant marital discord. Will utilize insight oriented therapy along with  CBT to reduce the associated anxiety/worry symptoms. Marital therapy will be recommended when appropriate. Goal date is 3-25.    Patient was seen in provider office.  Session note: Davionne states he has always been drawn toward strong personalities. Once in, he then finds self wanting to pull away. He clarifies that "anything he thinks is wrong to do in a marriage is infidelity".  He struggles with the feeling that "if I knew then what I know now, I would not be in this relationship". That is, he "discovers" things about the person after being with them for a while. He says wife has a bad history of drugs, but is open book and has recovered and is successful. Yet, he sees parts of her that are angry and can be verbally and physically (throwing things) violent. He is very aware of his attention seeking behavior and his need to be center of someone's attention. He says that he thinks he is attracted to "dynamic individuals" and wants to hang around. He tends to always focus on the positive. He says his wife works, takes care of their child, keeps a good house. He does not address how he feels about her and how he feels about their relationship. Gets teary eyed as he tries to talk about feelings toward each other. He thinks that he "irritates" his wife.  Talked with him about his infidelity as a way to manage his dissatisfaction in his marriage. The porn and chat with women on line served as an escape. Need to address his marital issues and his infidelity behaviors.     Diagnoses:  Adjustment Disorder with Anxiety  Plan of Care: Outpatient Individual Psychotherapy and Marital therapy as needed.   Garrel Ridgel, PhD 7:35a-8:30a 55 minutes

## 2023-05-31 ENCOUNTER — Ambulatory Visit: Payer: BC Managed Care – PPO | Admitting: Psychology

## 2023-05-31 DIAGNOSIS — F4322 Adjustment disorder with anxiety: Secondary | ICD-10-CM

## 2023-05-31 NOTE — Progress Notes (Addendum)
[facility] Counselor Initial Adult Exam  Name: Roy Esparza Date: 05/31/2023 MRN: [id number] DOB: 06/08/1986 PCP: [name], MD (Inactive)     Roy Esparza participated from home, via video, and consented to treatment. Therapist participated from home office. We met online due to COVID pandemic.   Guardian/Payee:  N/A    Paperwork requested: No   Reason for Visit /Presenting Problem: Marital discord, anxiety, compulsive behaviors.  Mental Status Exam: Appearance:   Casual     Behavior:  Appropriate  Motor:  Normal  Speech/Language:   Clear and Coherent  Affect:  Appropriate  Mood:  anxious  Thought process:  normal  Thought content:    WNL  Sensory/Perceptual disturbances:    WNL  Orientation:  oriented to person, place, and situation  Attention:  Good  Concentration:  Good  Memory:  WNL  Fund of knowledge:   Good  Insight:    Good  Judgment:   Poor  Impulse Control:  Poor    Reported Symptoms:  anxious, worry, sadness  Risk Assessment: Danger to Self:  No Self-injurious Behavior: No Danger to Others: No Duty to Warn:no Physical Aggression / Violence:No  Access to Firearms a concern:  unknown Gang Involvement:No  Patient / guardian was educated about steps to take if suicide or homicide risk level increases between visits: n/a While future psychiatric events cannot be accurately predicted, the patient does not currently require acute inpatient psychiatric care and does not currently meet [facility] involuntary commitment criteria.  Substance Abuse History: Current substance abuse: No     Past Psychiatric History:   No previous psychological problems have been observed Outpatient Providers:N/A History of Psych Hospitalization: No  Psychological Testing:  N/A    Abuse History:  Victim of: No.,  N/A    Report needed: No. Victim of  Neglect:No. Perpetrator of  N/A   Witness / Exposure to Domestic Violence: No   Protective Services Involvement: No  Witness to [employer] Violence:  No   Family History:  Family History  Problem Relation Age of Onset   Diabetes Mother        Type II DM   Hypertension Mother    Obesity Mother    Heart failure Father    Heart disease Father    Asthma Sister    Allergic rhinitis Sister     Living situation: the patient lives with their family  Sexual Orientation: Straight  Relationship Status: married  Name of spouse / other:Roy Esparza If a parent, number of children / ages:16 months  Support Systems: Location manager Stress:  No   Income/Employment/Disability: Employment  Financial planner:  unknown  Educational History: Education: Risk manager: Protestant  Any cultural differences that may affect / interfere with treatment:  not applicable   Recreation/Hobbies: unknown  Stressors: Marital or family conflict    Strengths: Family  Barriers:  unknown   Legal History: Pending legal issue / charges: The patient has no significant history of legal issues. History of legal issue / charges:  N/A  Medical History/Surgical History: not reviewed Past Medical History:  Diagnosis Date   Allergy    Asthma    Obesity     No  past surgical history on file.  Medications: Current Outpatient Medications  Medication Sig Dispense Refill   albuterol (PROAIR HFA) 108 (90 BASE) MCG/ACT inhaler INHALE 1-2 PUFFS INTO THE LUNGS EVERY 6 (SIX) HOURS AS NEEDED FOR WHEEZING. 8.5 each 12   albuterol (PROAIR HFA) 108 (90 BASE) MCG/ACT inhaler Inhale 1 or 2 puffs every 6 hrs as needed for wheezing. PATIENT NEEDS OFFICE VISIT FOR ADDITIONAL REFILLS 8.5 g 0   albuterol (PROVENTIL HFA;VENTOLIN HFA) 108 (90 BASE) MCG/ACT inhaler Inhale 2 puffs into the lungs every 4 (four) hours as needed for wheezing or shortness of breath (cough, shortness of breath  or wheezing.). 1 Inhaler 12   cetirizine (ZYRTEC) 10 MG tablet Take 1 tablet (10 mg total) by mouth daily. 30 tablet 3   Fluticasone-Salmeterol (ADVAIR DISKUS) 250-50 MCG/DOSE AEPB Inhale 1 puff into the lungs 2 (two) times daily. Need office visit for additional refills. 1 each 0   Fluticasone-Salmeterol (ADVAIR) 250-50 MCG/DOSE AEPB Inhale 1 puff into the lungs every 12 (twelve) hours. 60 each 5   Multiple Vitamin (MULTI-VITAMIN PO) Take by mouth daily.     Current Facility-Administered Medications  Medication Dose Route Frequency Provider Last Rate Last Admin   albuterol (PROVENTIL) (2.5 MG/3ML) 0.083% nebulizer solution 2.5 mg  2.5 mg Nebulization Daily Roy Dane, MD   2.5 mg at 06/22/13 1409    No Known Allergies  Initial note: Patient is self referred. He had worked for [employer] for 12 years.Married for 2 years and has a 66 month old. Also moved to town 2 years ago. He wants to talk about navigating a healthy relationship. Wife caught him cheating and he says he wants to stay married. He says he has never "not cheated". Says he genuinely loves his wife and wants to work on why he lies. He says they are both [place] and share spiritual views and values. Roy Esparza says both his wife and mother are both "strong willed individuals". He does think that marriage counseling will be useful, but he wants to work on what he brings to the table. He says he gives in to immediate gratification. This is his first marriage and he feels very committed to it being his only marriage. He states his wife will always call him on his BS, and can be confronting with what she sees. With all the change in his life, his mom also was diagnosed with uterine cancer. That was hard for him as they are close. She is now doing better.  He is from [place] and parents intact (married 37 years). His father had an MI in front of him when he was 39. 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  Moved to [place] to be with girl that he dated 8-9 years. Wife's name is Roy Esparza and she did not know about his past infidelity in relationships. He eventually went back to school and got his degree in biology.  Roy Esparza says that he tends to "recycle" relationships in that he breaks up with girlfriend, and then dates them again later. He says that he will occasionally watch You Tube clips and will follow up with porn. Has always watched porn since age 43. He went on a website for escorts and started talking to a particular individual, with no intension of meeting. Escort was in it for the money and he says he was in it for the attention. There was one particular woman, but he never met up with  her. He does say that the plans were getting out of control and going in direction of meeting up. He got intoxicated one night and the girl sent him a video. He did tell wife he had issues like this in the past. There was a couple of instances with a friend of their that would flirt with him. When he first got married, he went through a year of fearing that he made a wrong decision to get married.  FOO: Older brother and older sister (half) both local.Wife's family in [place], but may move to [place]. 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 Her mother comes down every few months and stays a couple of months. He says that Roy Esparza is committed to relationship. She was a party girl back in the day and used a lot of substances in the past. No history of mental health disorders and no medication.  His goal is to understand his own behavior and reconcile  that behavior in the context of his spiritual life. We discussed comfort level with discussing religion and spirituality.  Goals/Tx. Plan: Treatment will involve outpatient individual therapy to reduce compulsive behaviors that has created significant marital discord. Will utilize insight  oriented therapy along with CBT to reduce the associated anxiety/worry symptoms. Marital therapy will be recommended when appropriate. Goal date is 3-25.    Patient was seen in provider office.  Session note: Angelia states he is in a "weird" spot, having addressed a lot of issues the last session. We addressed "where to start' and he says he wants to be "the best person he can be for my partner" and doesn't want to do anything to jeopardize his marriage. We talked about why he turns to porn rather than address more complicated issues with partner. He talked about his deep involvement in his church and how it dictated his life and dating life for so long. He reported still having strong feelings for his former girlfriend that he dated x 10 years. They split basically because she didn't fir the mold of what he felt he needed to do (based on upbringing). He feels he is a "type B and needs a type A". He says his mom was very encouraging and loving and always took lead of family going to church and fellowship. The break-up with that 10 year relationship (Roy Esparza) was not solely based on his girlfriend's lack of religious commitment. He is finding that "the grass is not always greener" and there are certain constants he needs to address in all relationships. Roy Esparza (wife) grew up in a similar religious family with the same expectations. He says "that is why I married her and she is Type A". Her father is a reverend, but was raised Jewish. Mother is also a Optician, dispensing. Father also had a drug problem. Roy Esparza became a Optician, dispensing, but did not get ordained. Roy Esparza considered going into ministry as well. When wife was minister in church in [place], she was doing cocaine and quit. She went back to [place] and had an overdose. She then moved to [place] to get away from bad influences. She went to 12 step program to get clean.  He talked about some of their arguments. An example is that their child was fussy and he asked her to  come replace him. She got upset because he raised his voice at her. Later in the day, she shut him  down and he just retreated. Next day, he got upset when wife corrected him in front of their child. He got angry with her and told her to shut up. He later apologizes and takes responsibility.  We talked about the need moving forward for [place] to address his marital relationship.      Diagnoses:  Adjustment Disorder with Anxiety  Plan of Care: Outpatient Individual Psychotherapy and Marital therapy as needed.   Garrel Ridgel, PhD 11:40a-12:30p 50 minutes

## 2023-06-13 ENCOUNTER — Ambulatory Visit: Payer: BC Managed Care – PPO | Admitting: Psychology

## 2023-06-13 DIAGNOSIS — F4322 Adjustment disorder with anxiety: Secondary | ICD-10-CM

## 2023-06-13 NOTE — Progress Notes (Addendum)
[facility] Counselor Initial Adult Exam  Name: Roy Esparza Date: 06/13/2023 MRN: [id number] DOB: 1986/03/23 PCP: [name], MD (Inactive)        Guardian/Payee:  N/A    Paperwork requested: No   Reason for Visit /Presenting Problem: Marital discord, anxiety, compulsive behaviors.  Mental Status Exam: Appearance:   Casual     Behavior:  Appropriate  Motor:  Normal  Speech/Language:   Clear and Coherent  Affect:  Appropriate  Mood:  anxious  Thought process:  normal  Thought content:    WNL  Sensory/Perceptual disturbances:    WNL  Orientation:  oriented to person, place, and situation  Attention:  Good  Concentration:  Good  Memory:  WNL  Fund of knowledge:   Good  Insight:    Good  Judgment:   Poor  Impulse Control:  Poor    Reported Symptoms:  anxious, worry, sadness  Risk Assessment: Danger to Self:  No Self-injurious Behavior: No Danger to Others: No Duty to Warn:no Physical Aggression / Violence:No  Access to Firearms a concern:  unknown Gang Involvement:No  Patient / guardian was educated about steps to take if suicide or homicide risk level increases between visits: n/a While future psychiatric events cannot be accurately predicted, the patient does not currently require acute inpatient psychiatric care and does not currently meet [facility] involuntary commitment criteria.  Substance Abuse History: Current substance abuse: No     Past Psychiatric History:   No previous psychological problems have been observed Outpatient Providers:N/A History of Psych Hospitalization: No  Psychological Testing:  N/A    Abuse History:  Victim of: No.,  N/A    Report needed: No. Victim of Neglect:No. Perpetrator of  N/A   Witness / Exposure to Domestic Violence: No   Protective Services Involvement: No   Witness to [employer] Violence:  No   Family History:  Family History  Problem Relation Age of Onset   Diabetes Mother        Type II DM   Hypertension Mother    Obesity Mother    Heart failure Father    Heart disease Father    Asthma Sister    Allergic rhinitis Sister     Living situation: the patient lives with their family  Sexual Orientation: Straight  Relationship Status: married  Name of spouse / other:[name] If a parent, number of children / ages:16 months  Support Systems: Location manager Stress:  No   Income/Employment/Disability: Employment  Financial planner:  unknown  Educational History: Education: Risk manager: Protestant  Any cultural differences that may affect / interfere with treatment:  not applicable   Recreation/Hobbies: unknown  Stressors: Marital or family conflict    Strengths: Family  Barriers:  unknown   Legal History: Pending legal issue / charges: The patient has no significant history of legal issues. History of legal issue / charges:  N/A  Medical History/Surgical History: not reviewed Past Medical History:  Diagnosis Date   Allergy    Asthma    Obesity     No past surgical history on file.  Medications: Current  Outpatient Medications  Medication Sig Dispense Refill   albuterol (PROAIR HFA) 108 (90 BASE) MCG/ACT inhaler INHALE 1-2 PUFFS INTO THE LUNGS EVERY 6 (SIX) HOURS AS NEEDED FOR WHEEZING. 8.5 each 12   albuterol (PROAIR HFA) 108 (90 BASE) MCG/ACT inhaler Inhale 1 or 2 puffs every 6 hrs as needed for wheezing. PATIENT NEEDS OFFICE VISIT FOR ADDITIONAL REFILLS 8.5 g 0   albuterol (PROVENTIL HFA;VENTOLIN HFA) 108 (90 BASE) MCG/ACT inhaler Inhale 2 puffs into the lungs every 4 (four) hours as needed for wheezing or shortness of breath (cough, shortness of breath or wheezing.). 1 Inhaler 12   cetirizine (ZYRTEC) 10 MG tablet Take 1 tablet (10 mg total) by mouth daily. 30 tablet 3    Fluticasone-Salmeterol (ADVAIR DISKUS) 250-50 MCG/DOSE AEPB Inhale 1 puff into the lungs 2 (two) times daily. Need office visit for additional refills. 1 each 0   Fluticasone-Salmeterol (ADVAIR) 250-50 MCG/DOSE AEPB Inhale 1 puff into the lungs every 12 (twelve) hours. 60 each 5   Multiple Vitamin (MULTI-VITAMIN PO) Take by mouth daily.     Current Facility-Administered Medications  Medication Dose Route Frequency Provider Last Rate Last Admin   albuterol (PROVENTIL) (2.5 MG/3ML) 0.083% nebulizer solution 2.5 mg  2.5 mg Nebulization Daily Carmelina Dane, MD   2.5 mg at 06/22/13 1409    No Known Allergies  Initial note: Patient is self referred. He had worked for [employer] for 12 years.Married for 2 years and has a 6 month old. Also moved to town 2 years ago. He wants to talk about navigating a healthy relationship. Wife caught him cheating and he says he wants to stay married. He says he has never "not cheated". Says he genuinely loves his wife and wants to work on why he lies. He says they are both [place] and share spiritual views and values. Roy Esparza says both his wife and mother are both "strong willed individuals". He does think that marriage counseling will be useful, but he wants to work on what he brings to the table. He says he gives in to immediate gratification. This is his first marriage and he feels very committed to it being his only marriage. He states his wife will always call him on his BS, and can be confronting with what she sees. With all the change in his life, his mom also was diagnosed with uterine cancer. That was hard for him as they are close. She is now doing better.  He is from [place] and parents intact (married 37 years). His father had an MI in front of him when he was 63. 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 Moved to [place] to be with girl that he dated 8-9 years. Wife's name is Roy Esparza and she did not know about his past  infidelity in relationships. He eventually went back to school and got his degree in biology.  Roy Esparza says that he tends to "recycle" relationships in that he breaks up with girlfriend, and then dates them again later. He says that he will occasionally watch You Tube clips and will follow up with porn. Has always watched porn since age 17. He went on a website for escorts and started talking to a particular individual, with no intension of meeting. Escort was in it for the money and he says he was in it for the attention. There was one particular woman, but he never met up with her. He does say that the plans were  getting out of control and going in direction of meeting up. He got intoxicated one night and the girl sent him a video. He did tell wife he had issues like this in the past. There was a couple of instances with a friend of their that would flirt with him. When he first got married, he went through a year of fearing that he made a wrong decision to get married.  FOO: Older brother and older sister (half) both local.Wife's family in [place], but may move to [place]. 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 Her mother comes down every few months and stays a couple of months. He says that Roy Esparza is committed to relationship. She was a party girl back in the day and used a lot of substances in the past. No history of mental health disorders and no medication.  His goal is to understand his own behavior and reconcile  that behavior in the context of his spiritual life. We discussed comfort level with discussing religion and spirituality.  Goals/Tx. Plan: Treatment will involve outpatient individual therapy to reduce compulsive behaviors that has created significant marital discord. Will utilize insight oriented therapy along with CBT to reduce the associated anxiety/worry symptoms. Marital therapy will be recommended  when appropriate. Goal date is 3-25.    Patient was seen in provider office.  Session note: Roy Esparza states that he got a promotion at work and that the job is going well. His mother in law, that is at their home, has a positive influence on her daughter. He says he is able to focus on the positive, like the fact that their plan is falling in to place. We talked about his response to marital conflicts and the likelihood that it precipitates his involvement with porn. He turns to porn when stressed and looking for relief and "escape". He will record "episodes".           Diagnoses:  Adjustment Disorder with Anxiety  Plan of Care: Outpatient Individual Psychotherapy and Marital therapy as needed.   Garrel Ridgel, PhD 2:15a-3:00p 45 minutes
